# Patient Record
Sex: Male | Born: 1975 | Race: White | Hispanic: No | Marital: Single | Smoking: Current every day smoker
Health system: Southern US, Community
[De-identification: ages and names within clinical notes are randomized; demographics above are authoritative.]

## PROBLEM LIST (undated history)

## (undated) DIAGNOSIS — C801 Malignant (primary) neoplasm, unspecified: Secondary | ICD-10-CM

## (undated) DIAGNOSIS — F1910 Other psychoactive substance abuse, uncomplicated: Secondary | ICD-10-CM

## (undated) DIAGNOSIS — G4730 Sleep apnea, unspecified: Secondary | ICD-10-CM

## (undated) HISTORY — DX: Other psychoactive substance abuse, uncomplicated: F19.10

## (undated) HISTORY — PX: SKIN CANCER EXCISION: SHX779

## (undated) HISTORY — DX: Sleep apnea, unspecified: G47.30

## (undated) HISTORY — DX: Malignant (primary) neoplasm, unspecified: C80.1

---

## 2005-09-15 ENCOUNTER — Emergency Department: Payer: Self-pay | Admitting: Emergency Medicine

## 2006-08-18 ENCOUNTER — Emergency Department: Payer: Self-pay | Admitting: Emergency Medicine

## 2006-08-20 ENCOUNTER — Emergency Department: Payer: Self-pay | Admitting: Emergency Medicine

## 2007-10-29 IMAGING — CR LEFT MIDDLE FINGER 2+V
1 series · 3 of 3 positions shown · non-contrast
Comparison: none

REASON FOR EXAM: injury    ER WAIT
COMMENTS:

[Series 1: view not recorded · 0.17mm/px · 3 of 3 slices shown]
[im 1/3]
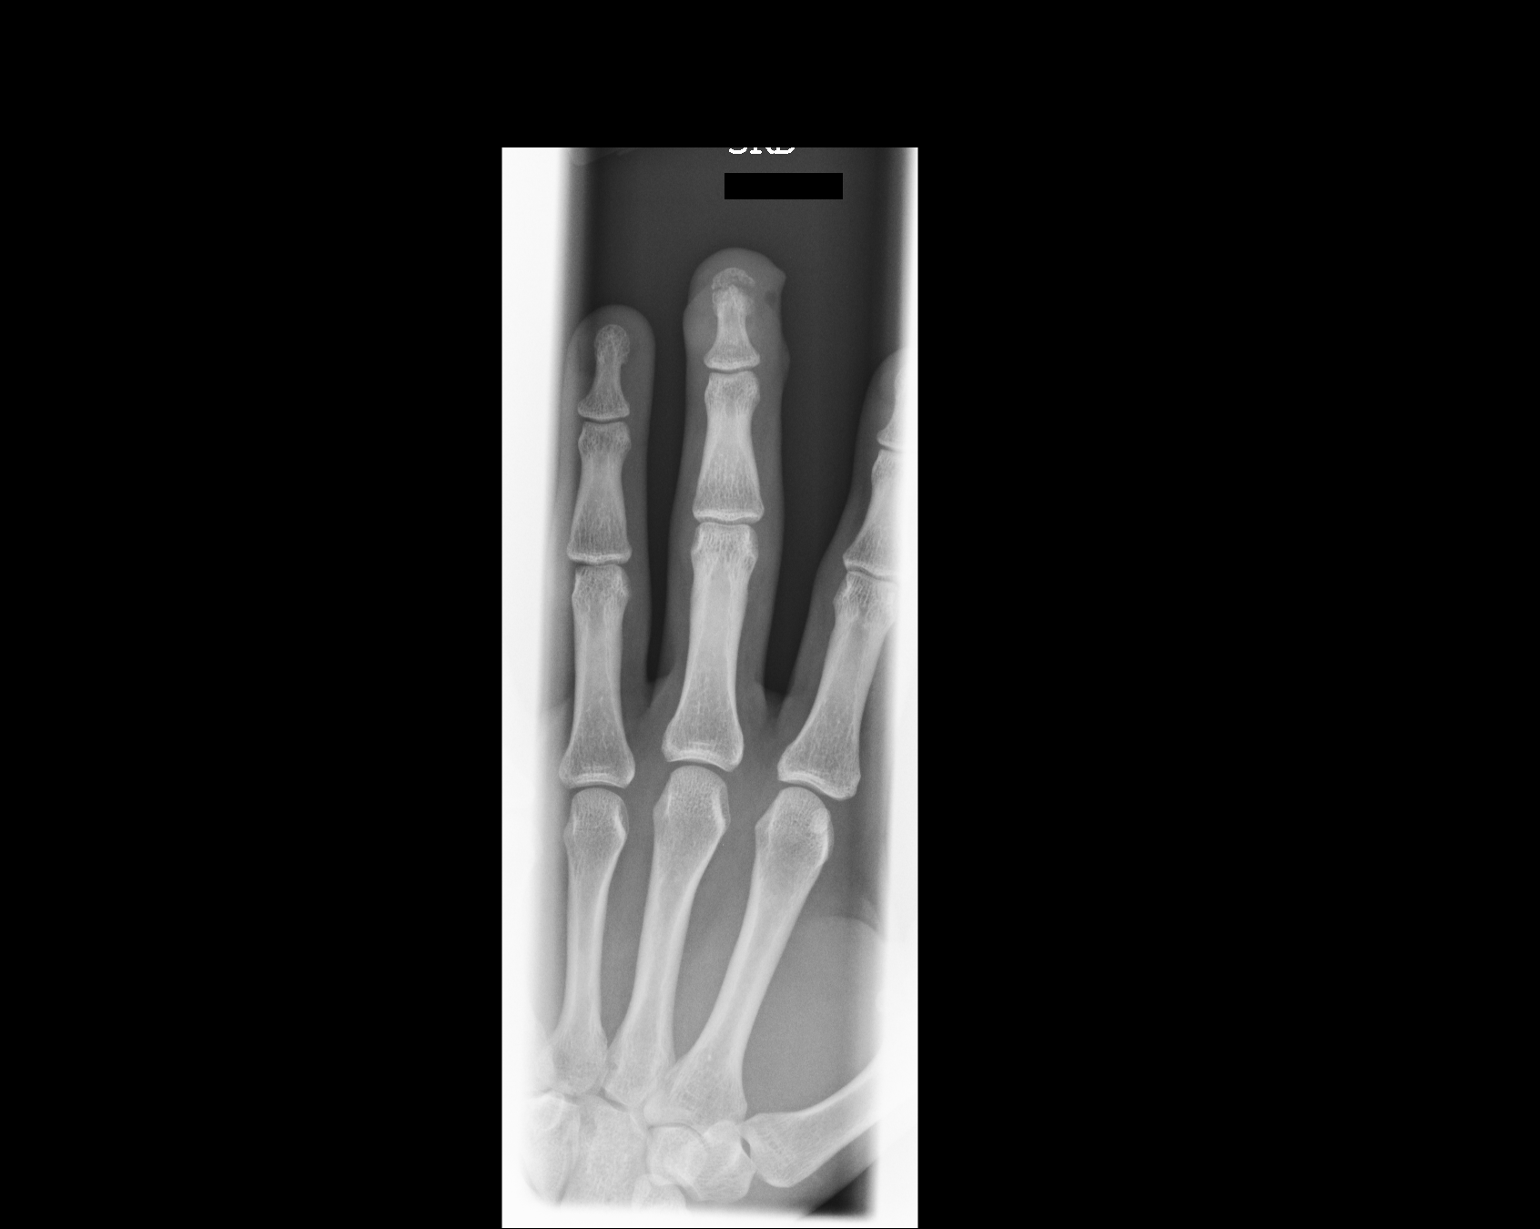
[im 2/3]
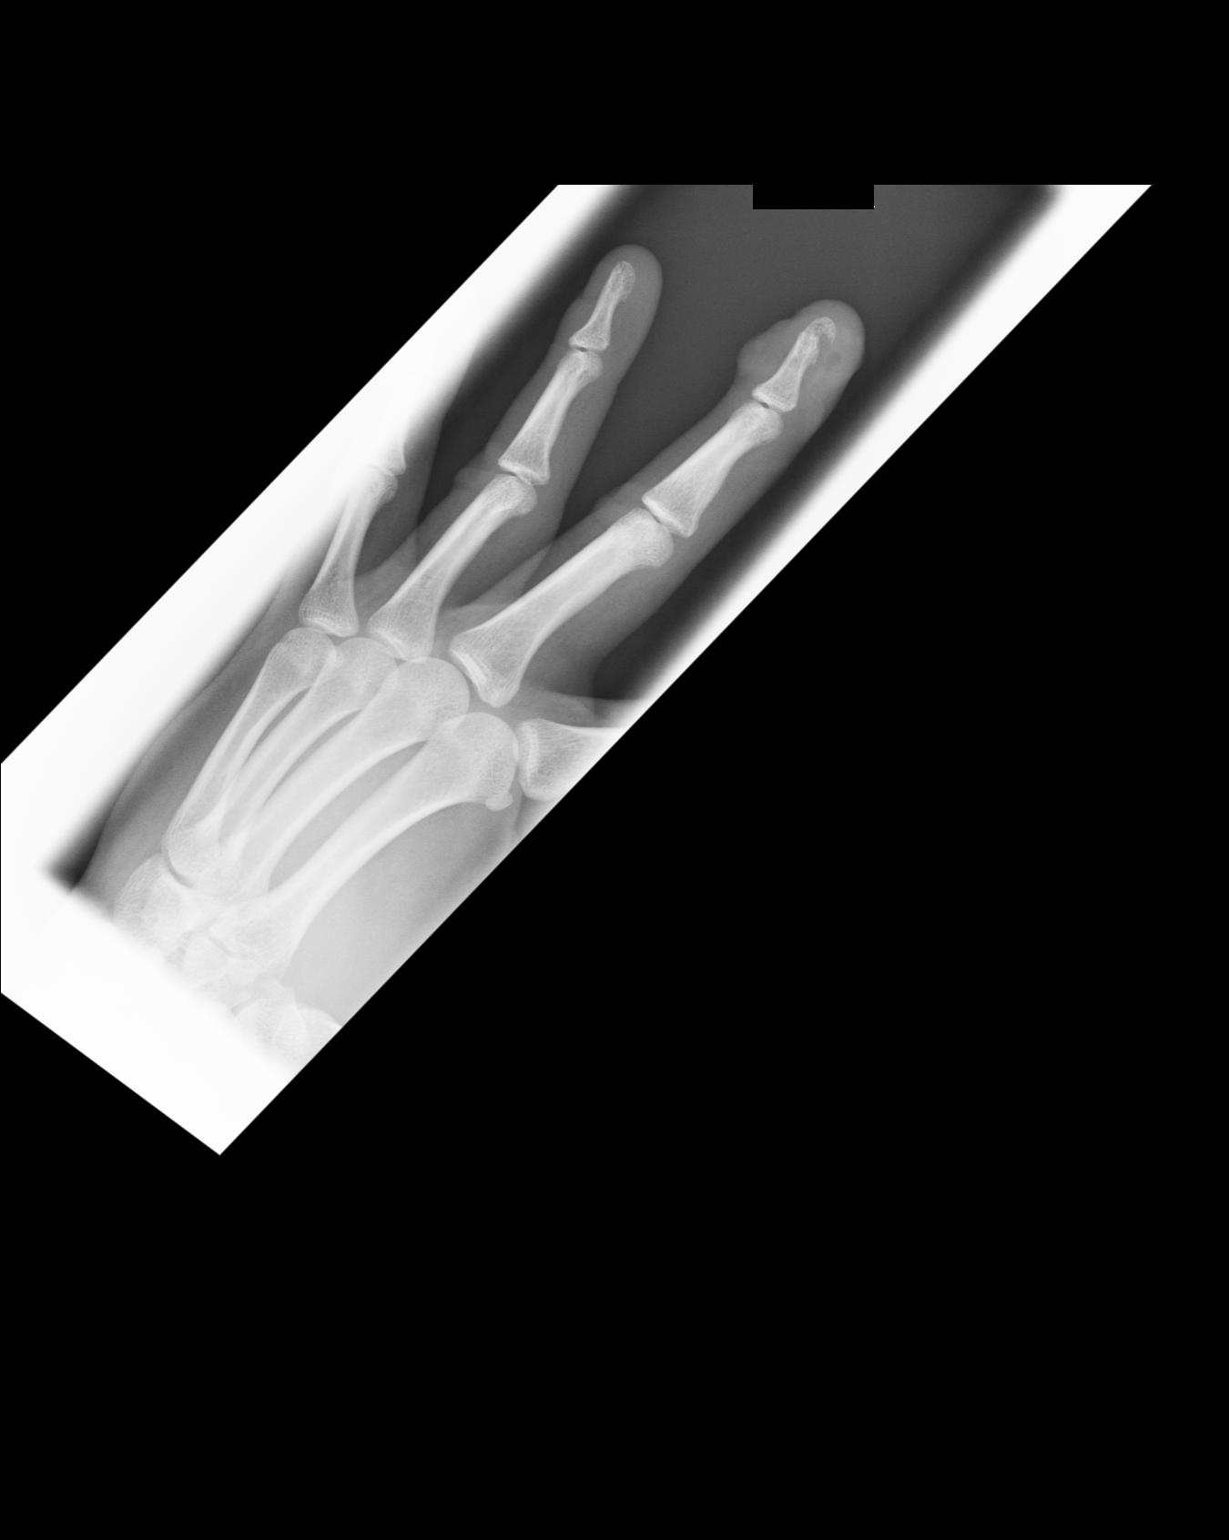
[im 3/3]
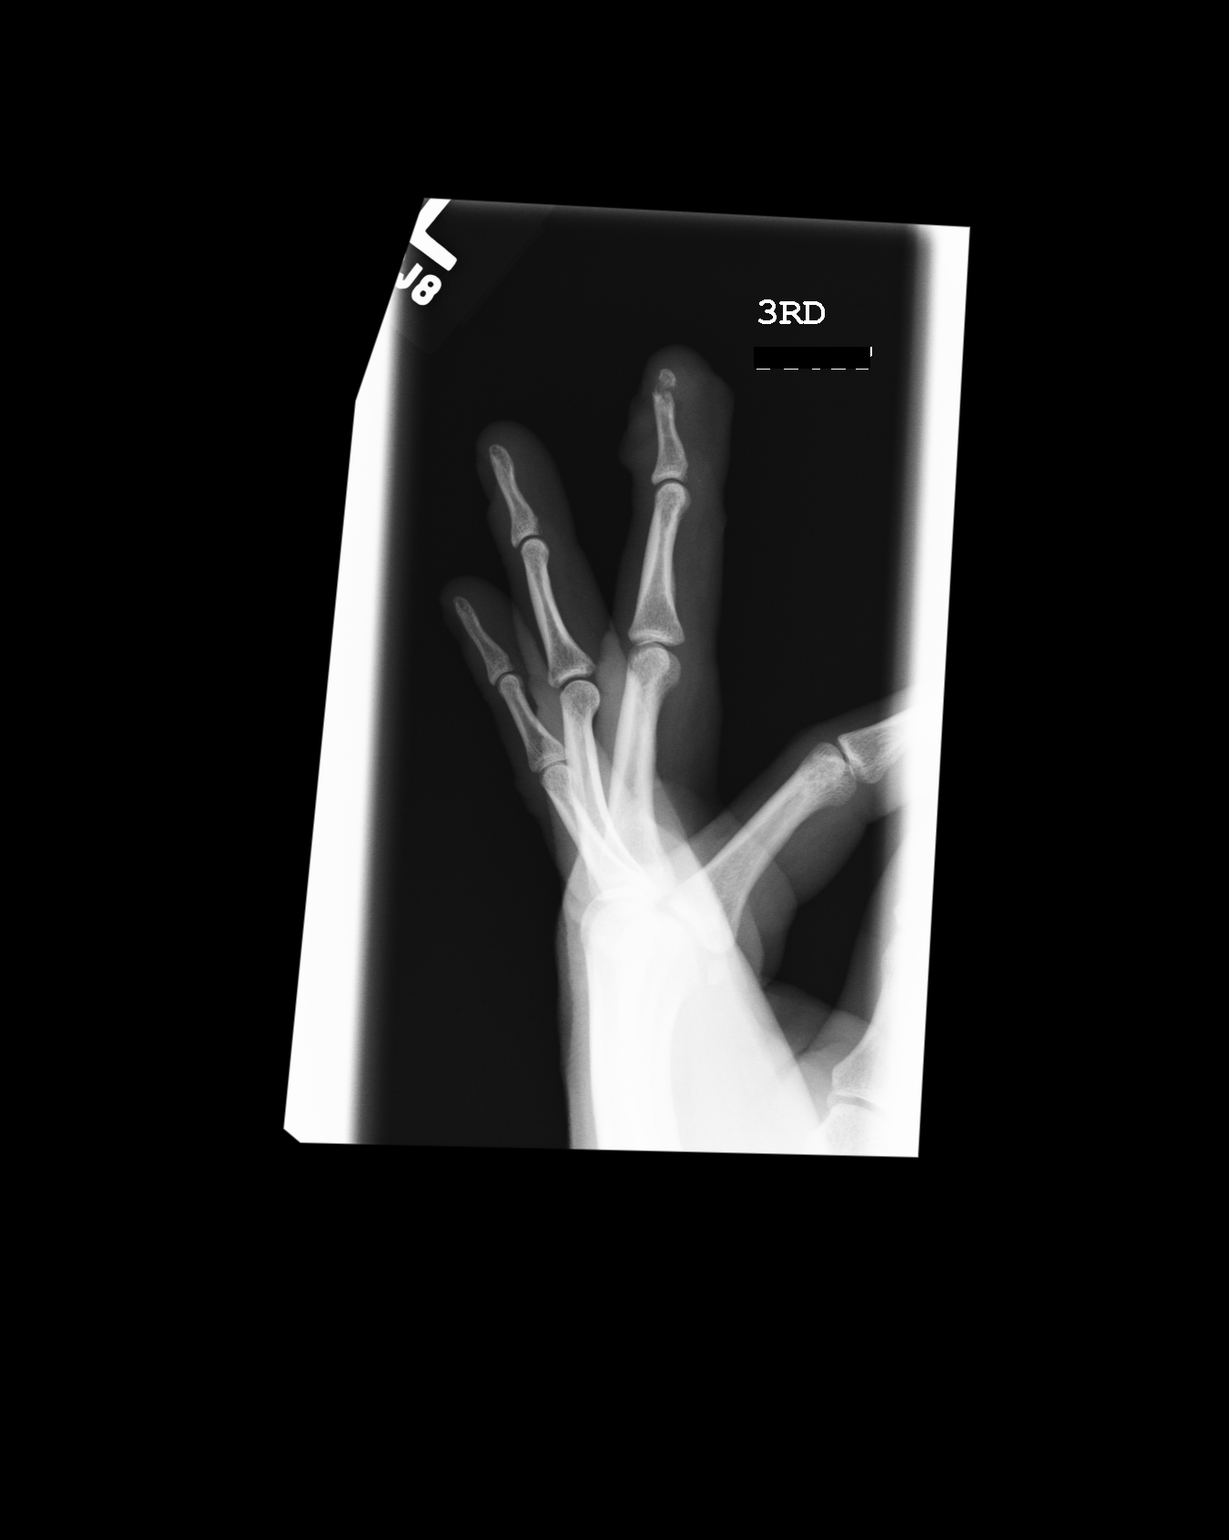

[3 of 3 positions shown; findings below may reference images not displayed]

PROCEDURE:     DXR - DXR FINGER MID 3RD DIGIT LT HAND  - August 18, 2006  [DATE]

RESULT:     Three views of the LEFT third finger show a fracture of the tuft
of the distal phalanx.  Bony fracture components are minimally displaced.
Associated soft tissue injury is observed.  No radiodense soft tissue
foreign body is seen.
IMPRESSION: 1)Fracture of the tuft of the distal phalanx of the LEFT middle finger.

2)No additional bony abnormalities are seen.

3)No radiopaque soft tissue foreign body is identified.

## 2008-08-12 ENCOUNTER — Emergency Department: Payer: Self-pay | Admitting: Emergency Medicine

## 2009-01-29 ENCOUNTER — Emergency Department: Payer: Self-pay | Admitting: Emergency Medicine

## 2016-09-24 DIAGNOSIS — C4491 Basal cell carcinoma of skin, unspecified: Secondary | ICD-10-CM

## 2016-09-24 HISTORY — DX: Basal cell carcinoma of skin, unspecified: C44.91

## 2017-04-09 ENCOUNTER — Ambulatory Visit: Payer: Self-pay | Admitting: Adult Health Nurse Practitioner

## 2017-04-09 DIAGNOSIS — R030 Elevated blood-pressure reading, without diagnosis of hypertension: Secondary | ICD-10-CM | POA: Insufficient documentation

## 2017-04-09 DIAGNOSIS — L819 Disorder of pigmentation, unspecified: Secondary | ICD-10-CM | POA: Insufficient documentation

## 2017-04-09 DIAGNOSIS — Z0000 Encounter for general adult medical examination without abnormal findings: Secondary | ICD-10-CM

## 2017-04-09 HISTORY — DX: Elevated blood-pressure reading, without diagnosis of hypertension: R03.0

## 2017-04-09 NOTE — Progress Notes (Signed)
  Patient: Trevor Estrada Male    DOB: 1976-03-26   41 y.o.   MRN: [id number] Visit Date: 04/09/2017  Today's Provider: Staci Acosta, NP   Chief Complaint  Patient presents with  . New Patient (Initial Visit)    Check for skin cancer, prior basal cell removed   Subjective:    HPI  Pt states that he thinks he has a spot of melanoma on the L testicle and basal cell on his R arm and chest.  States that the R arm and chest have been there since last summer and the melanoma x 3 years.  He thought the melanoma was a mole at first and now it looks like a little red bump.   Does not take any medication.      Denies family hx of DM, HTN, CA.   No Known Allergies Previous Medications   No medications on file    Review of Systems  All other systems reviewed and are negative.   Social History  Substance Use Topics  . Smoking status: Current Every Day Smoker    Packs/day: 0.75    Years: 30.00    Types: Cigarettes  . Smokeless tobacco: Not on file  . Alcohol use Yes     Comment: 2-3 12 packs a month   Objective:   BP (!) 143/96   Pulse 92   Temp 98.3 F (36.8 C)   Wt 192 lb 12.8 oz (87.5 kg)   Physical Exam  Constitutional: He is oriented to person, place, and time. He appears well-developed and well-nourished.  HENT:  Head: Normocephalic and atraumatic.  Neck: Normal range of motion. Neck supple.  Cardiovascular: Normal rate, regular rhythm, normal heart sounds and intact distal pulses.   Pulmonary/Chest: Effort normal and breath sounds normal.  Abdominal: Soft. Bowel sounds are normal.  Neurological: He is alert and oriented to person, place, and time.  Skin: Skin is warm and dry.     Vitals reviewed.       Assessment & Plan:      BP elevated today without dx of HTN.  Discussed low sodium diet and exercise.  Discussed smoking cessation.   FU in 4 weeks for BP repeat.      Due to hx of carcinoma removal will refer to Derm for skin surveillance.      Staci Acosta, NP   [facility]

## 2017-04-10 LAB — COMPREHENSIVE METABOLIC PANEL
ALK PHOS: 70 IU/L (ref 39–117)
ALT: 37 IU/L (ref 0–44)
AST: 35 IU/L (ref 0–40)
Albumin/Globulin Ratio: 2 (ref 1.2–2.2)
Albumin: 4.5 g/dL (ref 3.5–5.5)
BILIRUBIN TOTAL: 0.4 mg/dL (ref 0.0–1.2)
BUN/Creatinine Ratio: 6 — ABNORMAL LOW (ref 9–20)
BUN: 5 mg/dL — ABNORMAL LOW (ref 6–20)
CHLORIDE: 102 mmol/L (ref 96–106)
CO2: 21 mmol/L (ref 20–29)
Calcium: 9.1 mg/dL (ref 8.7–10.2)
Creatinine, Ser: 0.84 mg/dL (ref 0.76–1.27)
GFR calc Af Amer: 128 mL/min/{1.73_m2} (ref >59)
GFR calc non Af Amer: 110 mL/min/{1.73_m2} (ref >59)
GLOBULIN, TOTAL: 2.3 g/dL (ref 1.5–4.5)
Glucose: 88 mg/dL (ref 65–99)
Potassium: 3.7 mmol/L (ref 3.5–5.2)
SODIUM: 139 mmol/L (ref 134–144)
Total Protein: 6.8 g/dL (ref 6.0–8.5)

## 2017-04-10 LAB — LIPID PANEL
CHOLESTEROL TOTAL: 205 mg/dL — AB (ref 100–199)
Chol/HDL Ratio: 8.9 ratio — ABNORMAL HIGH (ref 0.0–5.0)
HDL: 23 mg/dL — ABNORMAL LOW (ref >39)
Triglycerides: 484 mg/dL — ABNORMAL HIGH (ref 0–149)

## 2017-04-10 LAB — CBC
HEMATOCRIT: 43.1 % (ref 37.5–51.0)
HEMOGLOBIN: 15 g/dL (ref 13.0–17.7)
MCH: 31.6 pg (ref 26.6–33.0)
MCHC: 34.8 g/dL (ref 31.5–35.7)
MCV: 91 fL (ref 79–97)
Platelets: 305 10*3/uL (ref 150–379)
RBC: 4.74 x10E6/uL (ref 4.14–5.80)
RDW: 14 % (ref 12.3–15.4)
WBC: 9.5 10*3/uL (ref 3.4–10.8)

## 2017-04-10 LAB — HEMOGLOBIN A1C
ESTIMATED AVERAGE GLUCOSE: 100 mg/dL
HEMOGLOBIN A1C: 5.1 % (ref 4.8–5.6)

## 2017-04-10 LAB — TSH: TSH: 1.52 u[IU]/mL (ref 0.450–4.500)

## 2017-04-16 ENCOUNTER — Telehealth: Payer: Self-pay

## 2017-04-16 NOTE — Telephone Encounter (Signed)
Called pt to go over lab results. Not available.

## 2017-04-16 NOTE — Telephone Encounter (Signed)
Called patient about referral to [place] appointment is July 12, at 2:30 pm and to let patient know he has been approved for [facility] through [facility].

## 2017-04-23 DIAGNOSIS — L570 Actinic keratosis: Secondary | ICD-10-CM

## 2017-04-23 HISTORY — DX: Actinic keratosis: L57.0

## 2017-06-03 ENCOUNTER — Ambulatory Visit: Payer: Self-pay | Admitting: Internal Medicine

## 2017-06-03 VITALS — BP 132/92 | HR 89 | Ht 69.0 in | Wt 190.0 lb

## 2017-06-03 DIAGNOSIS — I10 Essential (primary) hypertension: Secondary | ICD-10-CM

## 2017-06-03 MED ORDER — LISINOPRIL 10 MG PO TABS
5.0000 mg | ORAL_TABLET | Freq: Every day | ORAL | 1 refills | Status: DC
Start: 2017-06-03 — End: 2017-07-08

## 2017-06-03 NOTE — Progress Notes (Signed)
   Subjective:    Patient ID: Trevor Estrada, male    DOB: [birth year], 41 y.o.   MRN: [id number]  HPI   Pt is not currently taking medication for high BP. Pt does not recall history of high BP in family.    Patient Active Problem List   Diagnosis Date Noted  . Elevated BP without diagnosis of hypertension 04/09/2017  . Healthcare maintenance 04/09/2017  . Change in multiple pigmented skin lesions 04/09/2017   Allergies as of 06/03/2017   No Known Allergies     Medication List    as of 06/03/2017  9:47 AM   You have not been prescribed any medications.      Review of Systems     Objective:   Physical Exam  BP (!) 132/92   Pulse 89   Ht 5\' 9"  (1.753 m)   Wt 190 lb (86.2 kg)   BMI 28.06 kg/m   Manual BP recorded during encounter; 138/100    Assessment & Plan:   Advise patient on weight loss to decrease triglyceride levels.  Start taking lisinopril 5mg  once daily for the next 4 weeks.   F/U in 4 weeks w/ labs; Metb + lipid panel

## 2017-06-25 ENCOUNTER — Telehealth: Payer: Self-pay

## 2017-06-25 NOTE — Telephone Encounter (Signed)
Called to reschedule appt on 07/01/2017. Vmail not available.

## 2017-06-29 ENCOUNTER — Telehealth: Payer: Self-pay | Admitting: Pharmacy Technician

## 2017-06-29 NOTE — Telephone Encounter (Signed)
Patient eligible to receive medication assistance at [facility] through 2018, as long as eligibility requirements continue to be met.  Betty J. Kluttz Care Manager [facility] 

## 2017-07-01 ENCOUNTER — Other Ambulatory Visit: Payer: Self-pay

## 2017-07-08 ENCOUNTER — Ambulatory Visit: Payer: Self-pay | Admitting: Internal Medicine

## 2017-07-08 ENCOUNTER — Encounter: Payer: Self-pay | Admitting: Internal Medicine

## 2017-07-08 DIAGNOSIS — I10 Essential (primary) hypertension: Secondary | ICD-10-CM

## 2017-07-08 MED ORDER — LISINOPRIL 10 MG PO TABS: 5.0000 mg | ORAL_TABLET | Freq: Every day | ORAL | 3 refills | Status: DC
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
## 2017-07-09 LAB — LIPID PANEL
CHOL/HDL RATIO: 6.9 ratio — AB (ref 0.0–5.0)
CHOLESTEROL TOTAL: 187 mg/dL (ref 100–199)
HDL: 27 mg/dL — ABNORMAL LOW (ref >39)
LDL Calculated: 127 mg/dL — ABNORMAL HIGH (ref 0–99)
TRIGLYCERIDES: 167 mg/dL — AB (ref 0–149)
VLDL Cholesterol Cal: 33 mg/dL (ref 5–40)

## 2017-07-09 LAB — COMPREHENSIVE METABOLIC PANEL
A/G RATIO: 1.9 (ref 1.2–2.2)
ALT: 25 IU/L (ref 0–44)
AST: 24 IU/L (ref 0–40)
Albumin: 4.3 g/dL (ref 3.5–5.5)
Alkaline Phosphatase: 74 IU/L (ref 39–117)
BUN/Creatinine Ratio: 7 — ABNORMAL LOW (ref 9–20)
BUN: 6 mg/dL (ref 6–24)
Bilirubin Total: 0.4 mg/dL (ref 0.0–1.2)
CALCIUM: 9.5 mg/dL (ref 8.7–10.2)
CO2: 24 mmol/L (ref 20–29)
Chloride: 102 mmol/L (ref 96–106)
Creatinine, Ser: 0.84 mg/dL (ref 0.76–1.27)
GFR, EST AFRICAN AMERICAN: 127 mL/min/{1.73_m2} (ref >59)
GFR, EST NON AFRICAN AMERICAN: 110 mL/min/{1.73_m2} (ref >59)
Globulin, Total: 2.3 g/dL (ref 1.5–4.5)
Glucose: 92 mg/dL (ref 65–99)
POTASSIUM: 4.5 mmol/L (ref 3.5–5.2)
Sodium: 142 mmol/L (ref 134–144)
TOTAL PROTEIN: 6.6 g/dL (ref 6.0–8.5)

## 2017-07-09 LAB — CBC
HEMATOCRIT: 45.7 % (ref 37.5–51.0)
HEMOGLOBIN: 15.8 g/dL (ref 13.0–17.7)
MCH: 33.3 pg — AB (ref 26.6–33.0)
MCHC: 34.6 g/dL (ref 31.5–35.7)
MCV: 96 fL (ref 79–97)
Platelets: 301 10*3/uL (ref 150–379)
RBC: 4.75 x10E6/uL (ref 4.14–5.80)
RDW: 14.6 % (ref 12.3–15.4)
WBC: 6.4 10*3/uL (ref 3.4–10.8)

## 2017-12-30 ENCOUNTER — Other Ambulatory Visit: Payer: Self-pay

## 2018-01-06 ENCOUNTER — Ambulatory Visit: Payer: Self-pay | Admitting: Internal Medicine

## 2018-08-09 ENCOUNTER — Telehealth: Payer: Self-pay | Admitting: Pharmacy Technician

## 2018-08-09 NOTE — Telephone Encounter (Signed)
Patient failed to provide 2019 poi.  No additional medication assistance will be provided by [facility] without the required proof of income documentation.  Patient notified by letter.  Ridgely Anastacio J. Afton Mikelson Care Manager [facility] 

## 2020-07-11 ENCOUNTER — Ambulatory Visit: Payer: Self-pay | Admitting: Dermatology

## 2020-08-15 ENCOUNTER — Ambulatory Visit (INDEPENDENT_AMBULATORY_CARE_PROVIDER_SITE_OTHER): Payer: Self-pay | Admitting: Dermatology

## 2020-08-15 ENCOUNTER — Other Ambulatory Visit: Payer: Self-pay

## 2020-08-15 ENCOUNTER — Encounter: Payer: Self-pay | Admitting: Dermatology

## 2020-08-15 DIAGNOSIS — Z85828 Personal history of other malignant neoplasm of skin: Secondary | ICD-10-CM

## 2020-08-15 DIAGNOSIS — L821 Other seborrheic keratosis: Secondary | ICD-10-CM

## 2020-08-15 DIAGNOSIS — D485 Neoplasm of uncertain behavior of skin: Secondary | ICD-10-CM

## 2020-08-15 DIAGNOSIS — L700 Acne vulgaris: Secondary | ICD-10-CM

## 2020-08-15 DIAGNOSIS — L578 Other skin changes due to chronic exposure to nonionizing radiation: Secondary | ICD-10-CM

## 2020-08-15 NOTE — Progress Notes (Deleted)
   Follow-Up Visit   Subjective  Trevor Estrada is a 44 y.o. male who presents for the following: Lesions (on the R jaw, L ear, and R chest - patient has a history of basal cell carcinomas and is concerned they may be cancerous).  The following portions of the chart were reviewed this encounter and updated as appropriate:     Review of Systems:  No other skin or systemic complaints except as noted in HPI or Assessment and Plan.  Objective  Well appearing patient in no apparent distress; mood and affect are within normal limits.  A focused examination was performed including the face, ears, trunk, and extremities. Relevant physical exam findings are noted in the Assessment and Plan.   Assessment & Plan    Actinic Damage - chronic, secondary to cumulative UV radiation exposure/sun exposure over time - diffuse scaly erythematous macules with underlying dyspigmentation - Recommend daily broad spectrum sunscreen SPF 30+ to sun-exposed areas, reapply every 2 hours as needed.  - Call for new or changing lesions.  No follow-ups on file.  Luther Redo, CMA, am acting as scribe for Sarina Ser, MD .

## 2020-08-15 NOTE — Progress Notes (Addendum)
New Patient Visit  Subjective  Trevor Estrada is a 44 y.o. male who presents for the following: Lesions (on the R jaw, L ear, and R chest - patient has a history of basal cell carcinomas and is concerned they may be cancerous). The patient presents for Upper Body Skin Exam (UBSE) for skin cancer screening and mole check.  The following portions of the chart were reviewed this encounter and updated as appropriate:  Tobacco  Allergies  Meds  Problems  Med Hx  Surg Hx  Fam Hx     Review of Systems:  No other skin or systemic complaints except as noted in HPI or Assessment and Plan.  Objective  Well appearing patient in no apparent distress; mood and affect are within normal limits.  A focused examination was performed including the face, trunk, extremities. Relevant physical exam findings are noted in the Assessment and Plan.  Objective  R sup chest: 1.5 cm crusted pink patch   Objective  R cheek: 0.6 cm pink papule  Objective  L ear concha: 0.4 cm flesh colored papule   Objective  Inside the right upper eyelid: Firm nodule.   Assessment & Plan  Neoplasm of uncertain behavior of skin (3) R sup chest  Epidermal / dermal shaving  Lesion diameter (cm):  1.5 Informed consent: discussed and consent obtained   Timeout: patient name, date of birth, surgical site, and procedure verified   Procedure prep:  Patient was prepped and draped in usual sterile fashion Prep type:  Isopropyl alcohol Anesthesia: the lesion was anesthetized in a standard fashion   Anesthetic:  1% lidocaine w/ epinephrine 1-100,000 buffered w/ 8.4% NaHCO3 Instrument used: flexible razor blade   Hemostasis achieved with: pressure, aluminum chloride and electrodesiccation   Outcome: patient tolerated procedure well   Post-procedure details: sterile dressing applied and wound care instructions given   Dressing type: bandage and petrolatum    Destruction of lesion Complexity: extensive     Destruction method: electrodesiccation and curettage   Informed consent: discussed and consent obtained   Timeout:  patient name, date of birth, surgical site, and procedure verified Procedure prep:  Patient was prepped and draped in usual sterile fashion Prep type:  Isopropyl alcohol Anesthesia: the lesion was anesthetized in a standard fashion   Anesthetic:  1% lidocaine w/ epinephrine 1-100,000 buffered w/ 8.4% NaHCO3 Curettage performed in three different directions: Yes   Electrodesiccation performed over the curetted area: Yes   Lesion length (cm):  1.5 Lesion width (cm):  1.5 Margin per side (cm):  0.2 Final wound size (cm):  1.9 Hemostasis achieved with:  pressure, aluminum chloride and electrodesiccation Outcome: patient tolerated procedure well with no complications   Post-procedure details: sterile dressing applied and wound care instructions given   Dressing type: bandage and petrolatum    R cheek  Epidermal / dermal shaving  Lesion diameter (cm):  0.6 Informed consent: discussed and consent obtained   Timeout: patient name, date of birth, surgical site, and procedure verified   Procedure prep:  Patient was prepped and draped in usual sterile fashion Prep type:  Isopropyl alcohol Anesthesia: the lesion was anesthetized in a standard fashion   Anesthetic:  1% lidocaine w/ epinephrine 1-100,000 buffered w/ 8.4% NaHCO3 Instrument used: flexible razor blade   Hemostasis achieved with: pressure, aluminum chloride and electrodesiccation   Outcome: patient tolerated procedure well   Post-procedure details: sterile dressing applied and wound care instructions given   Dressing type: bandage and petrolatum  Destruction of lesion Complexity: extensive   Destruction method: electrodesiccation and curettage   Informed consent: discussed and consent obtained   Timeout:  patient name, date of birth, surgical site, and procedure verified Procedure prep:  Patient was prepped  and draped in usual sterile fashion Prep type:  Isopropyl alcohol Anesthesia: the lesion was anesthetized in a standard fashion   Anesthetic:  1% lidocaine w/ epinephrine 1-100,000 buffered w/ 8.4% NaHCO3 Curettage performed in three different directions: Yes   Electrodesiccation performed over the curetted area: Yes   Lesion length (cm):  0.6 Lesion width (cm):  0.6 Margin per side (cm):  0.2 Final wound size (cm):  1 Hemostasis achieved with:  pressure, aluminum chloride and electrodesiccation Outcome: patient tolerated procedure well with no complications   Post-procedure details: sterile dressing applied and wound care instructions given   Dressing type: bandage and petrolatum    L ear concha  Skin / nail biopsy Type of biopsy: tangential   Informed consent: discussed and consent obtained   Timeout: patient name, date of birth, surgical site, and procedure verified   Procedure prep:  Patient was prepped and draped in usual sterile fashion Prep type:  Isopropyl alcohol Anesthesia: the lesion was anesthetized in a standard fashion   Anesthetic:  1% lidocaine w/ epinephrine 1-100,000 buffered w/ 8.4% NaHCO3 Instrument used: flexible razor blade   Hemostasis achieved with: pressure, aluminum chloride and electrodesiccation   Outcome: patient tolerated procedure well   Post-procedure details: sterile dressing applied and wound care instructions given   Dressing type: bandage and petrolatum    Destruction of lesion Complexity: extensive   Destruction method: electrodesiccation and curettage   Informed consent: discussed and consent obtained   Timeout:  patient name, date of birth, surgical site, and procedure verified Procedure prep:  Patient was prepped and draped in usual sterile fashion Prep type:  Isopropyl alcohol Anesthesia: the lesion was anesthetized in a standard fashion   Anesthetic:  1% lidocaine w/ epinephrine 1-100,000 buffered w/ 8.4% NaHCO3 Curettage performed in  three different directions: Yes   Electrodesiccation performed over the curetted area: Yes   Lesion length (cm):  0.4 Lesion width (cm):  0.4 Margin per side (cm):  0 Final wound size (cm):  0.4 Hemostasis achieved with:  pressure, aluminum chloride and electrodesiccation Outcome: patient tolerated procedure well with no complications   Post-procedure details: sterile dressing applied and wound care instructions given   Dressing type: bandage and petrolatum    Acne vulgaris - acne papule vs cyst of eyelid.   Inside the right upper eyelid May resolve by its self.  If worsens or not resolving, see Ophthalmologist. Benign appearing, observe for changes.   Seborrheic Keratoses - Stuck-on, waxy, tan-brown papules and plaques  - Discussed benign etiology and prognosis. - Observe - Call for any changes  Actinic Damage - chronic, secondary to cumulative UV radiation exposure/sun exposure over time - diffuse scaly erythematous macules with underlying dyspigmentation - Recommend daily broad spectrum sunscreen SPF 30+ to sun-exposed areas, reapply every 2 hours as needed.  - Call for new or changing lesions.   Return in about 4 months (around 12/13/2020) for follow up in 4-6 months.  Luther Redo, CMA, am acting as scribe for Sarina Ser, MD .  Documentation: I have reviewed the above documentation for accuracy and completeness, and I agree with the above.  Sarina Ser, MD

## 2020-08-15 NOTE — Patient Instructions (Signed)

## 2020-08-20 ENCOUNTER — Telehealth: Payer: Self-pay

## 2020-08-20 NOTE — Telephone Encounter (Signed)
-----   Message from Ralene Bathe, MD sent at 08/16/2020  5:44 PM EDT ----- Diagnosis 1. Skin , right sup chest BASAL CELL CARCINOMA, SUPERFICIAL AND NODULAR PATTERNS 2. Skin , right cheek BASAL CELL CARCINOMA, SUPERFICIAL AND NODULAR PATTERNS 3. Skin , left ear concha SEBORRHEIC KERATOSIS, IRRITATED  1&2- both Cancer - BCC Both already treated Recheck next visit 3- benign irritated keratosis No further treatment needed.

## 2020-08-20 NOTE — Telephone Encounter (Signed)
Advised patient of results/hd  

## 2021-03-04 ENCOUNTER — Emergency Department: Payer: Self-pay

## 2021-03-04 ENCOUNTER — Inpatient Hospital Stay
Admission: EM | Admit: 2021-03-04 | Discharge: 2021-03-05 | DRG: 159 | Discharge status: Against Medical Advice | Payer: Self-pay | Attending: Internal Medicine | Admitting: Internal Medicine

## 2021-03-04 ENCOUNTER — Other Ambulatory Visit: Payer: Self-pay

## 2021-03-04 ENCOUNTER — Encounter: Payer: Self-pay | Admitting: Emergency Medicine

## 2021-03-04 DIAGNOSIS — L570 Actinic keratosis: Secondary | ICD-10-CM | POA: Diagnosis present

## 2021-03-04 DIAGNOSIS — R739 Hyperglycemia, unspecified: Secondary | ICD-10-CM | POA: Diagnosis present

## 2021-03-04 DIAGNOSIS — D72829 Elevated white blood cell count, unspecified: Secondary | ICD-10-CM | POA: Diagnosis present

## 2021-03-04 DIAGNOSIS — K122 Cellulitis and abscess of mouth: Principal | ICD-10-CM | POA: Diagnosis present

## 2021-03-04 DIAGNOSIS — Z8249 Family history of ischemic heart disease and other diseases of the circulatory system: Secondary | ICD-10-CM

## 2021-03-04 DIAGNOSIS — F17210 Nicotine dependence, cigarettes, uncomplicated: Secondary | ICD-10-CM | POA: Diagnosis present

## 2021-03-04 DIAGNOSIS — Y929 Unspecified place or not applicable: Secondary | ICD-10-CM

## 2021-03-04 DIAGNOSIS — I10 Essential (primary) hypertension: Secondary | ICD-10-CM

## 2021-03-04 DIAGNOSIS — Z85828 Personal history of other malignant neoplasm of skin: Secondary | ICD-10-CM

## 2021-03-04 DIAGNOSIS — Z20822 Contact with and (suspected) exposure to covid-19: Secondary | ICD-10-CM | POA: Diagnosis present

## 2021-03-04 DIAGNOSIS — T380X5A Adverse effect of glucocorticoids and synthetic analogues, initial encounter: Secondary | ICD-10-CM | POA: Diagnosis present

## 2021-03-04 DIAGNOSIS — E876 Hypokalemia: Secondary | ICD-10-CM

## 2021-03-04 DIAGNOSIS — G4730 Sleep apnea, unspecified: Secondary | ICD-10-CM | POA: Diagnosis present

## 2021-03-04 DIAGNOSIS — Z79899 Other long term (current) drug therapy: Secondary | ICD-10-CM

## 2021-03-04 HISTORY — DX: Essential (primary) hypertension: I10

## 2021-03-04 LAB — CBC WITH DIFFERENTIAL/PLATELET
Abs Immature Granulocytes: 0.09 10*3/uL — ABNORMAL HIGH (ref 0.00–0.07)
Basophils Absolute: 0.1 10*3/uL (ref 0.0–0.1)
Basophils Relative: 0 %
Eosinophils Absolute: 0.2 10*3/uL (ref 0.0–0.5)
Eosinophils Relative: 1 %
HCT: 40.3 % (ref 39.0–52.0)
Hemoglobin: 14.4 g/dL (ref 13.0–17.0)
Immature Granulocytes: 1 %
Lymphocytes Relative: 12 %
Lymphs Abs: 2.1 10*3/uL (ref 0.7–4.0)
MCH: 32.8 pg (ref 26.0–34.0)
MCHC: 35.7 g/dL (ref 30.0–36.0)
MCV: 91.8 fL (ref 80.0–100.0)
Monocytes Absolute: 1.3 10*3/uL — ABNORMAL HIGH (ref 0.1–1.0)
Monocytes Relative: 8 %
Neutro Abs: 13.5 10*3/uL — ABNORMAL HIGH (ref 1.7–7.7)
Neutrophils Relative %: 78 %
Platelets: 279 10*3/uL (ref 150–400)
RBC: 4.39 MIL/uL (ref 4.22–5.81)
RDW: 12.5 % (ref 11.5–15.5)
WBC: 17.2 10*3/uL — ABNORMAL HIGH (ref 4.0–10.5)
nRBC: 0 % (ref 0.0–0.2)

## 2021-03-04 LAB — COMPREHENSIVE METABOLIC PANEL
ALT: 31 U/L (ref 0–44)
AST: 21 U/L (ref 15–41)
Albumin: 3.7 g/dL (ref 3.5–5.0)
Alkaline Phosphatase: 64 U/L (ref 38–126)
Anion gap: 12 (ref 5–15)
BUN: 11 mg/dL (ref 6–20)
CO2: 24 mmol/L (ref 22–32)
Calcium: 8.7 mg/dL — ABNORMAL LOW (ref 8.9–10.3)
Chloride: 101 mmol/L (ref 98–111)
Creatinine, Ser: 0.91 mg/dL (ref 0.61–1.24)
GFR, Estimated: >60 mL/min (ref >60)
Glucose, Bld: 97 mg/dL (ref 70–99)
Potassium: 3.3 mmol/L — ABNORMAL LOW (ref 3.5–5.1)
Sodium: 137 mmol/L (ref 135–145)
Total Bilirubin: 1.2 mg/dL (ref 0.3–1.2)
Total Protein: 7 g/dL (ref 6.5–8.1)

## 2021-03-04 LAB — LACTIC ACID, PLASMA
Lactic Acid, Venous: 1 mmol/L (ref 0.5–1.9)
Lactic Acid, Venous: 1.2 mmol/L (ref 0.5–1.9)

## 2021-03-04 LAB — MAGNESIUM: Magnesium: 1.6 mg/dL — ABNORMAL LOW (ref 1.7–2.4)

## 2021-03-04 MED ORDER — ACETAMINOPHEN 325 MG PO TABS: 650.0000 mg | ORAL_TABLET | Freq: Four times a day (QID) | ORAL | Status: DC | PRN

## 2021-03-04 MED ORDER — DEXAMETHASONE SODIUM PHOSPHATE 10 MG/ML IJ SOLN
10.0000 mg | Freq: Once | INTRAMUSCULAR | Status: AC
  Administered 2021-03-04: 10 mg via INTRAVENOUS
  Filled 2021-03-04: qty 1

## 2021-03-04 MED ORDER — ACETAMINOPHEN 650 MG RE SUPP: 650.0000 mg | Freq: Four times a day (QID) | RECTAL | Status: DC | PRN

## 2021-03-04 MED ORDER — ENOXAPARIN SODIUM 60 MG/0.6ML IJ SOSY
0.5000 mg/kg | PREFILLED_SYRINGE | INTRAMUSCULAR | Status: DC
  Administered 2021-03-04: 47.5 mg via SUBCUTANEOUS
  Filled 2021-03-04: qty 0.6

## 2021-03-04 MED ORDER — POTASSIUM CHLORIDE 10 MEQ/100ML IV SOLN
10.0000 meq | INTRAVENOUS | Status: AC
  Administered 2021-03-04 (×3): 10 meq via INTRAVENOUS
  Filled 2021-03-04 (×3): qty 100

## 2021-03-04 MED ORDER — LIDOCAINE VISCOUS HCL 2 % MT SOLN
15.0000 mL | Freq: Three times a day (TID) | OROMUCOSAL | Status: DC | PRN
  Filled 2021-03-04: qty 15

## 2021-03-04 MED ORDER — SODIUM CHLORIDE 0.9 % IV SOLN
3.0000 g | Freq: Four times a day (QID) | INTRAVENOUS | Status: DC
  Administered 2021-03-04 – 2021-03-05 (×4): 3 g via INTRAVENOUS
  Filled 2021-03-04 (×2): qty 3
  Filled 2021-03-04: qty 8
  Filled 2021-03-04: qty 3
  Filled 2021-03-04: qty 8
  Filled 2021-03-04: qty 3
  Filled 2021-03-04: qty 8

## 2021-03-04 MED ORDER — MORPHINE SULFATE (PF) 4 MG/ML IV SOLN
4.0000 mg | Freq: Once | INTRAVENOUS | Status: AC
  Administered 2021-03-04: 4 mg via INTRAVENOUS
  Filled 2021-03-04: qty 1

## 2021-03-04 MED ORDER — ONDANSETRON HCL 4 MG/2ML IJ SOLN
4.0000 mg | Freq: Once | INTRAMUSCULAR | Status: AC
  Administered 2021-03-04: 4 mg via INTRAVENOUS
  Filled 2021-03-04: qty 2

## 2021-03-04 MED ORDER — SODIUM CHLORIDE 0.9% FLUSH
3.0000 mL | Freq: Two times a day (BID) | INTRAVENOUS | Status: DC
  Administered 2021-03-04 – 2021-03-05 (×2): 3 mL via INTRAVENOUS

## 2021-03-04 MED ORDER — IOHEXOL 300 MG/ML  SOLN
75.0000 mL | Freq: Once | INTRAMUSCULAR | Status: AC | PRN
  Administered 2021-03-04: 75 mL via INTRAVENOUS
  Filled 2021-03-04: qty 75

## 2021-03-04 MED ORDER — SODIUM CHLORIDE 0.9 % IV BOLUS
1000.0000 mL | Freq: Once | INTRAVENOUS | Status: AC
  Administered 2021-03-04: 1000 mL via INTRAVENOUS

## 2021-03-04 MED ORDER — LIDOCAINE VISCOUS HCL 2 % MT SOLN
15.0000 mL | Freq: Once | OROMUCOSAL | Status: AC
  Administered 2021-03-04: 15 mL via OROMUCOSAL
  Filled 2021-03-04: qty 15

## 2021-03-04 MED ORDER — LISINOPRIL 10 MG PO TABS
5.0000 mg | ORAL_TABLET | Freq: Every day | ORAL | Status: DC
  Filled 2021-03-04: qty 1

## 2021-03-04 MED ORDER — SODIUM CHLORIDE 0.9 % IV SOLN
INTRAVENOUS | Status: DC | PRN
  Administered 2021-03-04: 1000 mL via INTRAVENOUS

## 2021-03-04 MED ORDER — SODIUM CHLORIDE 0.9 % IV SOLN
3.0000 g | Freq: Once | INTRAVENOUS | Status: AC
  Administered 2021-03-04: 3 g via INTRAVENOUS
  Filled 2021-03-04: qty 8

## 2021-03-04 NOTE — Progress Notes (Signed)
PHARMACIST - PHYSICIAN COMMUNICATION  CONCERNING:  Enoxaparin (Lovenox) for DVT Prophylaxis    RECOMMENDATION: Patient was prescribed enoxaparin 40mg  q24 hours for VTE prophylaxis.   Filed Weights   03/04/21 1410  Weight: 97.1 kg (214 lb)    Body mass index is 31.6 kg/m.  Estimated Creatinine Clearance: 120.4 mL/min (by C-G formula based on SCr of 0.91 mg/dL).   Based on [place] patient is candidate for enoxaparin 0.5mg /kg TBW SQ every 24 hours based on BMI being >30.  DESCRIPTION: Pharmacy has adjusted enoxaparin dose per [facility] policy.  Patient is now receiving enoxaparin 47.5 mg every 24 hours    Benita Gutter 03/04/2021 7:21 PM

## 2021-03-04 NOTE — ED Triage Notes (Addendum)
Pt via POV from home. Pt was sent over from [facility]. Pt c/o throat swelling and facial swelling since Thursday. Denies SOB. Pt states it is painful to swallow. [place] did a strep test and it was negative. Denies pain, pt states throat is sore. Pt is A&Ox4 and NAD.

## 2021-03-04 NOTE — ED Notes (Signed)
See triage note  Presents with facial swelling and sore throat  States he felt like he had a sinus infection    Then he developed throat swelling   Low grade temp  Increased pain with swallowing

## 2021-03-04 NOTE — H&P (Signed)
History and Physical   Trevor Estrada ASN:[id number] DOB: 23-Aug-1976 DOA: 03/04/2021  PCP: [facility]  Patient coming from: Primary care office  Chief Complaint: Throat and facial swelling with difficulty swallowing  HPI: Trevor Estrada is a 45 y.o. male with medical history significant of hypertension, actinic keratosis, basal cell carcinoma who presents with throat and facial swelling.  Patient reports 4 days of throat and facial swelling that has been worsening.  States it has become painful/difficult to swallow.  Denies any shortness of breath.  Denies any pain when not attempting to swallow.  Was seen at outpatient clinic today and had a negative strep test there.  They sent him to the ED given his difficulty swallowing.  He states he has had some difficulty swallowing even liquids now.  He states the lidocaine he received in the ED helped with the pain but otherwise his pain is not very severe.  Denies fevers, chills, chest pain, shortness of breath, abdominal pain, constipation, diarrhea, nausea, vomiting.  ED Course: Vital signs in the ED are stable initially was tachycardic to the 110s but this is improved to around 100.  Satting well on room air.  Lab work-up showed CMP with potassium 3.3 and calcium 8.7.  CBC with leukocytosis to 17.2.  Lactic acid normal with repeat pending.  Blood cultures pending.  Respiratory panel for flu and COVID pending.  CT neck showed enlarged tonsils, attenuation at the tongue base with extension posteriorly.  Epiglottic fold edema.  Changes consistent with infection with forming abscess although malignancy is also on the differential.  Patient was discussed with ENT who states based on patient's exam and rapid onset of symptoms infection is most likely.  Recommend admission with IV antibiotics and monitor for improvement.  If patient improves within 48 hours can discharge with outpatient follow-up with ENT otherwise will need to be  reconsulted.  Patient received dose of morphine, Unasyn, Decadron, Zofran, lidocaine and a liter of fluids in the ED.  Review of Systems: As per HPI otherwise all other systems reviewed and are negative.   Past Medical History:  Diagnosis Date  . Actinic keratosis 04/23/2017   left temple at hairline   . Basal cell carcinoma 04/23/2017   right mid dorsum lateral forearm   . Basal cell carcinoma 04/23/2017   right superior chest   . Basal cell carcinoma 09/24/2016   right temple  . Basal cell carcinoma 08/15/2020   right sup chest, right cheek  . Cancer (HCC)    Basal cell  . Elevated BP without diagnosis of hypertension 04/09/2017  . HTN (hypertension) 03/04/2021  . Sleep apnea    patient suspected  . Substance abuse ([place])    Alcohol    Past Surgical History:  Procedure Laterality Date  . SKIN CANCER EXCISION  December    Social History  reports that he has been smoking cigarettes. He has a 22.50 pack-year smoking history. He has never used smokeless tobacco. He reports current alcohol use. He reports that he does not use drugs.  No Known Allergies  Family History  Problem Relation Age of Onset  . Cancer Maternal Aunt   . Cancer Paternal Uncle   . Heart disease Maternal Grandfather   Reviewed on admission  Prior to Admission medications   Medication Sig Start Date End Date Taking? Authorizing Provider  lisinopril (PRINIVIL,ZESTRIL) 10 MG tablet Take 0.5 tablets (5 mg total) by mouth daily. 07/08/17   Tawni Millers, MD  Physical Exam: Vitals:   03/04/21 1410 03/04/21 1743  BP: (!) 138/91 137/88  Pulse: (!) 119 100  Resp: 20 18  Temp: 98.9 F (37.2 C)   TempSrc: Oral   SpO2: 94% 95%  Weight: 97.1 kg   Height: 5\' 9"  (1.753 m)    Physical Exam Constitutional:      General: He is not in acute distress.    Appearance: Normal appearance.  HENT:     Head: Normocephalic and atraumatic.     Mouth/Throat:     Mouth: Mucous membranes are moist.     Pharynx:  Oropharynx is clear.     Comments: Mild tongue lifting and swelling in right oral cavity noted. Eyes:     Extraocular Movements: Extraocular movements intact.     Pupils: Pupils are equal, round, and reactive to light.  Neck:     Comments: Edema noted of submandibular space and superior anterior neck Cardiovascular:     Rate and Rhythm: Normal rate and regular rhythm.     Pulses: Normal pulses.     Heart sounds: Normal heart sounds.  Pulmonary:     Effort: Pulmonary effort is normal. No respiratory distress.     Breath sounds: Normal breath sounds. No stridor.  Abdominal:     General: Bowel sounds are normal. There is no distension.     Palpations: Abdomen is soft.     Tenderness: There is no abdominal tenderness.  Musculoskeletal:        General: No swelling or deformity.  Skin:    General: Skin is warm and dry.  Neurological:     General: No focal deficit present.     Mental Status: Mental status is at baseline.    Labs on Admission: I have personally reviewed following labs and imaging studies  CBC: Recent Labs  Lab 03/04/21 1653  WBC 17.2*  NEUTROABS 13.5*  HGB 14.4  HCT 40.3  MCV 91.8  PLT 409    Basic Metabolic Panel: Recent Labs  Lab 03/04/21 1652 03/04/21 1653  NA  --  137  K  --  3.3*  CL  --  101  CO2  --  24  GLUCOSE  --  97  BUN  --  11  CREATININE  --  0.91  CALCIUM  --  8.7*  MG 1.6*  --     GFR: Estimated Creatinine Clearance: 120.4 mL/min (by C-G formula based on SCr of 0.91 mg/dL).  Liver Function Tests: Recent Labs  Lab 03/04/21 1653  AST 21  ALT 31  ALKPHOS 64  BILITOT 1.2  PROT 7.0  ALBUMIN 3.7    Urine analysis: No results found for: COLORURINE, APPEARANCEUR, LABSPEC, PHURINE, GLUCOSEU, HGBUR, BILIRUBINUR, KETONESUR, PROTEINUR, UROBILINOGEN, NITRITE, LEUKOCYTESUR  Radiological Exams on Admission: CT Soft Tissue Neck W Contrast  Result Date: 03/04/2021 CLINICAL DATA:  Throat swelling and facial swelling EXAM: CT NECK  WITH CONTRAST TECHNIQUE: Multidetector CT imaging of the neck was performed using the standard protocol following the bolus administration of intravenous contrast. CONTRAST:  44mL OMNIPAQUE IOHEXOL 300 MG/ML  SOLN COMPARISON:  None. FINDINGS: Pharynx and larynx: Enlargement of the palatine tonsils. There is a 4 mm area of low attenuation on the right (series 2, image 35). There is also prominence of the lingual tonsils. Anteriorly, within the tongue base and floor of mouth, there is a 1.5 x 1.3 x 1.1 cm area of low attenuation (series 2, image 63) that probably communicates with a small area more superiorly on  image 59. Mild thickening of the epiglottis. Mild thickening of the aryepiglottic folds with effacement of piriform sinuses. Larynx is unremarkable. Salivary glands: Parotid and submandibular glands are unremarkable. Thyroid: Normal. Lymph nodes: Top normal and mildly enlarged cervical nodes bilaterally. For example, right level 2 node measuring 2.2 cm (series 2, image 45); right level 2 node measuring 1.7 cm on image 56; 1.4 cm left level 2 node on image 53. Vascular: Major neck vessels are patent. Limited intracranial: No abnormal enhancement. Visualized orbits: Unremarkable. Mastoids and visualized paranasal sinuses: Mastoid air cells are clear. Paranasal sinus mucosal thickening. Skeleton: Minor degenerative changes are present primarily at C5-C6. Upper chest: Included lung apices are clear. Other: None. IMPRESSION: Enlargement of palatine and lingual tonsils. Areas of low attenuation extending anteriorly into the tongue base and floor of mouth and within the right palatine tonsil. There is epiglottic and aryepiglottic fold edema. Given provided history, this could reflect an infectious process with abscess formation. However, appearance is also suspicious for malignancy with necrosis, noting smoking history. Bilateral cervical adenopathy, which could be reactive or metastatic. Electronically Signed   By:  Macy Mis M.D.   On: 03/04/2021 18:15   EKG: Not performed in the ED.  Assessment/Plan Principal Problem:   Ludwig's angina Active Problems:   Hypokalemia  Ludwick's angina > Patient presenting 4 days of throat and facial swelling now with difficulty swallowing.  Denies shortness of breath. > Submandibular space infection shown on CT neck.  Imaging differential did include malignancy however this is less likely given the acute course and exam. > ENT consulted in the ED and recommended admission with IV antibiotics.  If patient improves in 48 hours can DC with outpatient ENT follow-up.  Recommend reconsult for inpatient ENT consult if patient persist after 48 hours. > Received Unasyn, Decadron in the ED. - Monitor on telemetry overnight - Continue with Unasyn 3 g every 6 hours - Trend fever curve and white count - Full liquid diet  Hypokalemia > Potassium noted to be 3.3 in the ED. - Given difficulty swallowing we will replete with 30 mEq IV potassium - Check magnesium - Trend renal function and electrolytes  DVT prophylaxis: Lovenox  Code Status:   Full  Family Communication:  Patient states that his family knows he is staying and did not need further update tonight. Disposition Plan:   Patient is from:  Home  Anticipated DC to:  Home  Anticipated DC date:  1 to 3 days  Anticipated DC barriers: None  Consults called:  ENT consulted by EDP.  Recommends reconsult if patient not improving in 48 hours.   Admission status:  Observation, telemetry   Severity of Illness: The appropriate patient status for this patient is OBSERVATION. Observation status is judged to be reasonable and necessary in order to provide the required intensity of service to ensure the patient's safety. The patient's presenting symptoms, physical exam findings, and initial radiographic and laboratory data in the context of their medical condition is felt to place them at decreased risk for further clinical  deterioration. Furthermore, it is anticipated that the patient will be medically stable for discharge from the hospital within 2 midnights of admission. The following factors support the patient status of observation.   " The patient's presenting symptoms include throat and facial swelling, difficulty swallowing. " The physical exam findings include neck and submandibular swelling, raised tongue with mild edema noted in the oral cavity. " The initial radiographic and laboratory data are CT soft tissue  neck with enlarged tonsils, attenuation of the tongue base extending posteriorly, epiglottic fold edema consistent with infection with forming abscess with malignancy also on the differential.  Potassium 3.3, leukocytosis to 17.2.   Marcelyn Bruins MD [facility]  How to contact the [facility] Attending or Consulting provider [place] or covering provider during after hours [place], for this patient?   1. Check the care team in [facility] and look for a) attending/consulting [facility] provider listed and b) the [facility] team listed 2. Log into [email or web address] and use [place]'s universal password to access. If you do not have the password, please contact the hospital operator. 3. Locate the [facility] provider you are looking for under [facility] and page to a number that you can be directly reached. 4. If you still have difficulty reaching the provider, please page the [facility] (Director on Call) for the Hospitalists listed on amion for assistance.  03/04/2021, 7:44 PM

## 2021-03-04 NOTE — ED Provider Notes (Signed)
[facility] Emergency Department Provider Note  ____________________________________________  Time seen: Approximately 4:32 PM  I have reviewed the triage vital signs and the nursing notes.   HISTORY  Chief Complaint Oral Swelling    HPI Trevor Estrada is a 45 y.o. male who presents the emergency department complaining of sore throat, swelling under his chin/jaw.  Patient states that he has had a sore throat for almost a week.  It has been progressively worsening.  He has had voice changes, pain/difficulty due to pain with swallowing.  No difficulty breathing.  Patient denies any fevers, nasal congestion, cough.  Patient states that he has visible swelling under his jaw but has pain along the anterior neck as well.         Past Medical History:  Diagnosis Date  . Actinic keratosis 04/23/2017   left temple at hairline   . Basal cell carcinoma 04/23/2017   right mid dorsum lateral forearm   . Basal cell carcinoma 04/23/2017   right superior chest   . Basal cell carcinoma 09/24/2016   right temple  . Basal cell carcinoma 08/15/2020   right sup chest, right cheek  . Cancer (HCC)    Basal cell  . Sleep apnea    patient suspected  . Substance abuse ([place])    Alcohol    Patient Active Problem List   Diagnosis Date Noted  . Ludwig's angina 03/04/2021  . Elevated BP without diagnosis of hypertension 04/09/2017  . Healthcare maintenance 04/09/2017  . Change in multiple pigmented skin lesions 04/09/2017    Past Surgical History:  Procedure Laterality Date  . SKIN CANCER EXCISION  December    Prior to Admission medications   Medication Sig Start Date End Date Taking? Authorizing Provider  lisinopril (PRINIVIL,ZESTRIL) 10 MG tablet Take 0.5 tablets (5 mg total) by mouth daily. 07/08/17   Tawni Millers, MD    Allergies Patient has no known allergies.  Family History  Problem Relation Age of Onset  . Cancer Maternal Aunt   . Cancer  Paternal Uncle   . Heart disease Maternal Grandfather     Social History Social History   Tobacco Use  . Smoking status: Current Every Day Smoker    Packs/day: 0.75    Years: 30.00    Pack years: 22.50    Types: Cigarettes  . Smokeless tobacco: Never Used  Substance Use Topics  . Alcohol use: Yes    Comment: 2-3 12 packs a month  . Drug use: No     Review of Systems  Constitutional: No fever/chills Eyes: No visual changes. No discharge ENT: Sore throat with pain under the mandible Cardiovascular: no chest pain. Respiratory: no cough. No SOB. Gastrointestinal: No abdominal pain.  No nausea, no vomiting.  No diarrhea.  No constipation. Musculoskeletal: Negative for musculoskeletal pain. Skin: Negative for rash, abrasions, lacerations, ecchymosis. Neurological: Negative for headaches, focal weakness or numbness.  10 System ROS otherwise negative.  ____________________________________________   PHYSICAL EXAM:  VITAL SIGNS: ED Triage Vitals [03/04/21 1410]  Enc Vitals Group     BP (!) 138/91     Pulse Rate (!) 119     Resp 20     Temp 98.9 F (37.2 C)     Temp Source Oral     SpO2 94 %     Weight 214 lb (97.1 kg)     Height 5\' 9"  (1.753 m)     Head Circumference      Peak Flow  Pain Score 10     Pain Loc      Pain Edu?      Excl. in [place]?      Constitutional: Alert and oriented. Well appearing and in no acute distress. Eyes: Conjunctivae are normal. PERRL. EOMI. Head: Atraumatic. ENT:      Ears:       Nose: No congestion/rhinnorhea.      Mouth/Throat: Mucous membranes are moist.  Bilateral erythema and edema to the tonsils without significant difference in the hypertrophy between the tonsils.  No uvular deviation.  Patient's tongue is unable to fully extend, tender under the tongue to palpation with firmness.  Palpation in the submandibular area reveals significant tenderness as well as significant firmness from mandible to mandible and extending into  the anterior neck. Neck: No stridor.  Visualization of the anterior neck reveals edema with firmness extending from the submandibular area into the anterior neck.  No posterior tenderness.  No posterior findings. Hematological/Lymphatic/Immunilogical: Anterior cervical lymphadenopathy. Cardiovascular: Normal rate, regular rhythm. Normal S1 and S2.  Good peripheral circulation. Respiratory: Normal respiratory effort without tachypnea or retractions. Lungs CTAB. Good air entry to the bases with no decreased or absent breath sounds. Musculoskeletal: Full range of motion to all extremities. No gross deformities appreciated. Neurologic:  Normal speech and language. No gross focal neurologic deficits are appreciated.  Skin:  Skin is warm, dry and intact. No rash noted. Psychiatric: Mood and affect are normal. Speech and behavior are normal. Patient exhibits appropriate insight and judgement.   ____________________________________________   LABS (all labs ordered are listed, but only abnormal results are displayed)  Labs Reviewed  COMPREHENSIVE METABOLIC PANEL - Abnormal; Notable for the following components:      Result Value   Potassium 3.3 (*)    Calcium 8.7 (*)    All other components within normal limits  CBC WITH DIFFERENTIAL/PLATELET - Abnormal; Notable for the following components:   WBC 17.2 (*)    Neutro Abs 13.5 (*)    Monocytes Absolute 1.3 (*)    Abs Immature Granulocytes 0.09 (*)    All other components within normal limits  CULTURE, BLOOD (ROUTINE X 2)  CULTURE, BLOOD (ROUTINE X 2)  SARS CORONAVIRUS 2 (TAT 6-24 HRS)  LACTIC ACID, PLASMA  LACTIC ACID, PLASMA   ____________________________________________  EKG   ____________________________________________  RADIOLOGY I personally viewed and evaluated these images as part of my medical decision making, as well as reviewing the written report by the radiologist.  ED Provider Interpretation: Visualization of CT imaging  reveals edema, low-attenuation area in the floor the mouth at the base of the tongue.  Appears to be slightly worse on the right than left but does involve both sides.  There is epiglottic and aryepiglottic fold edema.  Based off clinical presentation with physical exam and labs, feel this reflects infectious process.  CT Soft Tissue Neck W Contrast  Result Date: 03/04/2021 CLINICAL DATA:  Throat swelling and facial swelling EXAM: CT NECK WITH CONTRAST TECHNIQUE: Multidetector CT imaging of the neck was performed using the standard protocol following the bolus administration of intravenous contrast. CONTRAST:  16mL OMNIPAQUE IOHEXOL 300 MG/ML  SOLN COMPARISON:  None. FINDINGS: Pharynx and larynx: Enlargement of the palatine tonsils. There is a 4 mm area of low attenuation on the right (series 2, image 35). There is also prominence of the lingual tonsils. Anteriorly, within the tongue base and floor of mouth, there is a 1.5 x 1.3 x 1.1 cm area of low attenuation (  series 2, image 63) that probably communicates with a small area more superiorly on image 59. Mild thickening of the epiglottis. Mild thickening of the aryepiglottic folds with effacement of piriform sinuses. Larynx is unremarkable. Salivary glands: Parotid and submandibular glands are unremarkable. Thyroid: Normal. Lymph nodes: Top normal and mildly enlarged cervical nodes bilaterally. For example, right level 2 node measuring 2.2 cm (series 2, image 45); right level 2 node measuring 1.7 cm on image 56; 1.4 cm left level 2 node on image 53. Vascular: Major neck vessels are patent. Limited intracranial: No abnormal enhancement. Visualized orbits: Unremarkable. Mastoids and visualized paranasal sinuses: Mastoid air cells are clear. Paranasal sinus mucosal thickening. Skeleton: Minor degenerative changes are present primarily at C5-C6. Upper chest: Included lung apices are clear. Other: None. IMPRESSION: Enlargement of palatine and lingual tonsils. Areas  of low attenuation extending anteriorly into the tongue base and floor of mouth and within the right palatine tonsil. There is epiglottic and aryepiglottic fold edema. Given provided history, this could reflect an infectious process with abscess formation. However, appearance is also suspicious for malignancy with necrosis, noting smoking history. Bilateral cervical adenopathy, which could be reactive or metastatic. Electronically Signed   By: Macy Mis M.D.   On: 03/04/2021 18:15    ____________________________________________    PROCEDURES  Procedure(s) performed:    Procedures    Medications  Ampicillin-Sulbactam (UNASYN) 3 g in sodium chloride 0.9 % 100 mL IVPB (has no administration in time range)  lisinopril (ZESTRIL) tablet 5 mg (has no administration in time range)  enoxaparin (LOVENOX) injection 40 mg (has no administration in time range)  sodium chloride flush (NS) 0.9 % injection 3 mL (has no administration in time range)  acetaminophen (TYLENOL) tablet 650 mg (has no administration in time range)    Or  acetaminophen (TYLENOL) suppository 650 mg (has no administration in time range)  sodium chloride 0.9 % bolus 1,000 mL (0 mLs Intravenous Stopped 03/04/21 1816)  ondansetron (ZOFRAN) injection 4 mg (4 mg Intravenous Given 03/04/21 1705)  morphine 4 MG/ML injection 4 mg (4 mg Intravenous Given 03/04/21 1705)  dexamethasone (DECADRON) injection 10 mg (10 mg Intravenous Given 03/04/21 1705)  Ampicillin-Sulbactam (UNASYN) 3 g in sodium chloride 0.9 % 100 mL IVPB (0 g Intravenous Stopped 03/04/21 1736)  iohexol (OMNIPAQUE) 300 MG/ML solution 75 mL (75 mLs Intravenous Contrast Given 03/04/21 1731)  lidocaine (XYLOCAINE) 2 % viscous mouth solution 15 mL (15 mLs Mouth/Throat Given 03/04/21 1801)     ____________________________________________   INITIAL IMPRESSION / ASSESSMENT AND PLAN / ED COURSE  Pertinent labs & imaging results that were available during my care of the  patient were reviewed by me and considered in my medical decision making (see chart for details).  Review of the [place] CSRS was performed in accordance of the [place] prior to dispensing any controlled drugs.           Patient's diagnosis is consistent with Ludwigs angina.  Patient presented to the emergency department complaining of sore throat, pain at the base of the tongue/anterior neck region with edema of the submandibular and anterior neck space.  Patient had been referred from urgent care after negative strep test.  Patient did have voice changes noticed on entering the room.  Physical exam revealed wheezing of the base of tongue, tenderness along the sublingual space and significant edema and tenderness in the submandibular space.  Given the voice changes, worsening sore throat, physical exam findings concern existed for Ludwick's angina, Lemierre's, peritonsillar abscess,  retropharyngeal abscess.  I discussed the patient with radiologist given the contrast shortage and it was determined that the patient would be an appropriate patient for CT contrast.  CT was performed and findings are consistent with infectious process at the base of the tongue with a area of attenuation that could be concerning for forming abscess.  Radiologist was concerned that this also could be a presentation for malignancy with necrosis.  I discussed the patient with on-call ENT, Dr. Kathyrn Sheriff.  Given the rapid onset as well as labs and physical exam findings it was felt that this was infectious.  Patient had already been started on Unasyn, Decadron while waiting for results.  At this time ENT recommends admission to the hospital service for IV antibiotics.  As long as patient is improving symptomatically he can have outpatient follow-up with ENT.  Any nonimprovement after 48 hours or worsening would necessitate ENT direct consult while in the hospital.  I will reach out to the hospitalist team at this time for admission.        ____________________________________________  FINAL CLINICAL IMPRESSION(S) / ED DIAGNOSES  Final diagnoses:  Ludwig's angina      NEW MEDICATIONS STARTED DURING THIS VISIT:  ED Discharge Orders    None          This chart was dictated using voice recognition software/Dragon. Despite best efforts to proofread, errors can occur which can change the meaning. Any change was purely unintentional.    Darletta Moll, PA-C 03/04/21 1919    Naaman Plummer, MD 03/05/21 [phone]

## 2021-03-05 DIAGNOSIS — D72828 Other elevated white blood cell count: Secondary | ICD-10-CM

## 2021-03-05 DIAGNOSIS — R739 Hyperglycemia, unspecified: Secondary | ICD-10-CM

## 2021-03-05 LAB — CBC
HCT: 38.1 % — ABNORMAL LOW (ref 39.0–52.0)
HCT: 38.1 % — ABNORMAL LOW (ref 39.0–52.0)
Hemoglobin: 13.1 g/dL (ref 13.0–17.0)
Hemoglobin: 13.3 g/dL (ref 13.0–17.0)
MCH: 32.5 pg (ref 26.0–34.0)
MCH: 32.8 pg (ref 26.0–34.0)
MCHC: 34.4 g/dL (ref 30.0–36.0)
MCHC: 34.9 g/dL (ref 30.0–36.0)
MCV: 94.5 fL (ref 80.0–100.0)
MCV: 94.1 fL (ref 80.0–100.0)
Platelets: 266 10*3/uL (ref 150–400)
Platelets: 321 10*3/uL (ref 150–400)
RBC: 4.03 MIL/uL — ABNORMAL LOW (ref 4.22–5.81)
RBC: 4.05 MIL/uL — ABNORMAL LOW (ref 4.22–5.81)
RDW: 12.3 % (ref 11.5–15.5)
RDW: 12.2 % (ref 11.5–15.5)
WBC: 18 10*3/uL — ABNORMAL HIGH (ref 4.0–10.5)
WBC: 25.8 10*3/uL — ABNORMAL HIGH (ref 4.0–10.5)
nRBC: 0 % (ref 0.0–0.2)
nRBC: 0 % (ref 0.0–0.2)

## 2021-03-05 LAB — HIV ANTIBODY (ROUTINE TESTING W REFLEX): HIV Screen 4th Generation wRfx: NONREACTIVE

## 2021-03-05 LAB — BASIC METABOLIC PANEL
Anion gap: 8 (ref 5–15)
BUN: 14 mg/dL (ref 6–20)
CO2: 27 mmol/L (ref 22–32)
Calcium: 8.6 mg/dL — ABNORMAL LOW (ref 8.9–10.3)
Chloride: 103 mmol/L (ref 98–111)
Creatinine, Ser: 0.82 mg/dL (ref 0.61–1.24)
GFR, Estimated: >60 mL/min (ref >60)
Glucose, Bld: 178 mg/dL — ABNORMAL HIGH (ref 70–99)
Potassium: 4.2 mmol/L (ref 3.5–5.1)
Sodium: 138 mmol/L (ref 135–145)

## 2021-03-05 LAB — SARS CORONAVIRUS 2 (TAT 6-24 HRS): SARS Coronavirus 2: NEGATIVE

## 2021-03-05 MED ORDER — OXYCODONE-ACETAMINOPHEN 5-325 MG PO TABS: 1.0000 | ORAL_TABLET | Freq: Four times a day (QID) | ORAL | 0 refills | Status: AC | PRN

## 2021-03-05 MED ORDER — NICOTINE 21 MG/24HR TD PT24
21.0000 mg | MEDICATED_PATCH | Freq: Every day | TRANSDERMAL | Status: DC
  Administered 2021-03-05: 21 mg via TRANSDERMAL
  Filled 2021-03-05: qty 1

## 2021-03-05 MED ORDER — NICOTINE 14 MG/24HR TD PT24: 14.0000 mg | MEDICATED_PATCH | Freq: Every day | TRANSDERMAL | Status: DC

## 2021-03-05 MED ORDER — AMOXICILLIN-POT CLAVULANATE 875-125 MG PO TABS: 1.0000 | ORAL_TABLET | Freq: Two times a day (BID) | ORAL | 0 refills | Status: AC

## 2021-03-05 NOTE — Plan of Care (Signed)
  Problem: Education: Goal: Knowledge of General Education information will improve Description: Including pain rating scale, medication(s)/side effects and non-pharmacologic comfort measures Outcome: Progressing   Problem: Clinical Measurements: Goal: Will remain free from infection Outcome: Not Progressing Goal: Respiratory complications will improve Outcome: Progressing   Problem: Nutrition: Goal: Adequate nutrition will be maintained Outcome: Progressing   Problem: Pain Managment: Goal: General experience of comfort will improve Outcome: Progressing

## 2021-03-05 NOTE — Progress Notes (Signed)
PROGRESS NOTE   HPI was taken from Dr. Trilby Drummer: Trevor Estrada is a 45 y.o. male with medical history significant of hypertension, actinic keratosis, basal cell carcinoma who presents with throat and facial swelling.             Patient reports 4 days of throat and facial swelling that has been worsening.  States it has become painful/difficult to swallow.  Denies any shortness of breath.  Denies any pain when not attempting to swallow.  Was seen at outpatient clinic today and had a negative strep test there.  They sent him to the ED given his difficulty swallowing.             He states he has had some difficulty swallowing even liquids now.  He states the lidocaine he received in the ED helped with the pain but otherwise his pain is not very severe.  Denies fevers, chills, chest pain, shortness of breath, abdominal pain, constipation, diarrhea, nausea, vomiting.  ED Course: Vital signs in the ED are stable initially was tachycardic to the 110s but this is improved to around 100.  Satting well on room air.  Lab work-up showed CMP with potassium 3.3 and calcium 8.7.  CBC with leukocytosis to 17.2.  Lactic acid normal with repeat pending.  Blood cultures pending.  Respiratory panel for flu and COVID pending.  CT neck showed enlarged tonsils, attenuation at the tongue base with extension posteriorly.  Epiglottic fold edema.  Changes consistent with infection with forming abscess although malignancy is also on the differential.  Patient was discussed with ENT who states based on patient's exam and rapid onset of symptoms infection is most likely.  Recommend admission with IV antibiotics and monitor for improvement.  If patient improves within 48 hours can discharge with outpatient follow-up with ENT otherwise will need to be reconsulted.  Patient received dose of morphine, Unasyn, Decadron, Zofran, lidocaine and a liter of fluids in the ED.   Trevor Estrada  JJH:[id number] DOB: 07-28-1976 DOA:  03/04/2021 PCP: Pcp, No    Assessment & Plan:   Principal Problem:   Ludwig's angina Active Problems:   Hypokalemia  Ludwig angina: w/ submadibular space infection as per CT neck. Continue on IV unasyn. S/p IV decadron x 1 in the ER. If pt improves in 48 hours can d/c w/ po abxs and f/u w/ ENT outpatient. If no improvement re-consult ENT inpatient. Pt able to swallow liquids today and will advance to soft diet   Hypokalemia: WNL today  Leukocytosis: secondary to above. Continue on IV abxs  Hyperglycemia: no hx of DM & possibly secondary to steroid use. Will check HbA1c    DVT prophylaxis: lovenox  Code Status: full  Family Communication:  Disposition Plan:  Likely d/c back home.   Level of care: Med-Surg   Status is: Observation  The patient remains OBS appropriate and will d/c before 2 midnights. Can d/c tomorrow w/ po abxs if pt continues to clinically improve  Dispo: The patient is from: Home              Anticipated d/c is to: Home              Patient currently is not medically stable to d/c.   Difficult to place patient: unclear    Consultants:      Procedures:    Antimicrobials: unasyn   Subjective: Pt c/o throat pain, but improved  Objective: Vitals:   03/04/21 1743 03/04/21 2029 03/04/21 2354  03/05/21 0547  BP: 137/88 120/86 120/82 (!) 119/92  Pulse: 100 96 88 90  Resp: 18 16 15 16   Temp:  99.4 F (37.4 C) 99 F (37.2 C) 97.8 F (36.6 C)  TempSrc:  Oral Oral Oral  SpO2: 95% 95% 94% 94%  Weight:      Height:        Intake/Output Summary (Last 24 hours) at 03/05/2021 0821 Last data filed at 03/05/2021 0636 Gross per 24 hour  Intake 1360.8 ml  Output --  Net 1360.8 ml   Filed Weights   03/04/21 1410  Weight: 97.1 kg    Examination:  General exam: Appears calm and comfortable  Respiratory system: Clear to auscultation. Respiratory effort normal. Cardiovascular system: S1 & S2 +. No  rubs, gallops or clicks.  Gastrointestinal  system: Abdomen is nondistended, soft and nontender. Normal bowel sounds heard. Central nervous system: Alert and oriented. Moves all extremities  Psychiatry: Judgement and insight appear normal. Flat mood and affect    Data Reviewed: I have personally reviewed following labs and imaging studies  CBC: Recent Labs  Lab 03/04/21 1653 03/05/21 0538  WBC 17.2* 18.0*  NEUTROABS 13.5*  --   HGB 14.4 13.1  HCT 40.3 38.1*  MCV 91.8 94.5  PLT 279 182   Basic Metabolic Panel: Recent Labs  Lab 03/04/21 1652 03/04/21 1653 03/05/21 0538  NA  --  137 138  K  --  3.3* 4.2  CL  --  101 103  CO2  --  24 27  GLUCOSE  --  97 178*  BUN  --  11 14  CREATININE  --  0.91 0.82  CALCIUM  --  8.7* 8.6*  MG 1.6*  --   --    GFR: Estimated Creatinine Clearance: 133.6 mL/min (by C-G formula based on SCr of 0.82 mg/dL). Liver Function Tests: Recent Labs  Lab 03/04/21 1653  AST 21  ALT 31  ALKPHOS 64  BILITOT 1.2  PROT 7.0  ALBUMIN 3.7   No results for input(s): LIPASE, AMYLASE in the last 168 hours. No results for input(s): AMMONIA in the last 168 hours. Coagulation Profile: No results for input(s): INR, PROTIME in the last 168 hours. Cardiac Enzymes: No results for input(s): CKTOTAL, CKMB, CKMBINDEX, TROPONINI in the last 168 hours. BNP (last 3 results) No results for input(s): PROBNP in the last 8760 hours. HbA1C: No results for input(s): HGBA1C in the last 72 hours. CBG: No results for input(s): GLUCAP in the last 168 hours. Lipid Profile: No results for input(s): CHOL, HDL, LDLCALC, TRIG, CHOLHDL, LDLDIRECT in the last 72 hours. Thyroid Function Tests: No results for input(s): TSH, T4TOTAL, FREET4, T3FREE, THYROIDAB in the last 72 hours. Anemia Panel: No results for input(s): VITAMINB12, FOLATE, FERRITIN, TIBC, IRON, RETICCTPCT in the last 72 hours. Sepsis Labs: Recent Labs  Lab 03/04/21 1652 03/04/21 2110  LATICACIDVEN 1.0 1.2    Recent Results (from the past 240  hour(s))  Culture, blood (routine x 2)     Status: None (Preliminary result)   Collection Time: 03/04/21  4:48 PM   Specimen: BLOOD  Result Value Ref Range Status   Specimen Description BLOOD BLOOD RIGHT ARM  Final   Special Requests   Final    BOTTLES DRAWN AEROBIC AND ANAEROBIC Blood Culture adequate volume   Culture   Final    NO GROWTH < 24 HOURS Performed at [facility], [address] [place],  [address]    Report Status PENDING  Incomplete  Culture, blood (routine x 2)     Status: None (Preliminary result)   Collection Time: 03/04/21  4:52 PM   Specimen: BLOOD  Result Value Ref Range Status   Specimen Description BLOOD RIGHT ANTECUBITAL  Final   Special Requests   Final    BOTTLES DRAWN AEROBIC AND ANAEROBIC Blood Culture results may not be optimal due to an excessive volume of blood received in culture bottles   Culture  Setup Time PENDING  Incomplete   Culture   Final    NO GROWTH < 24 HOURS Performed at [facility], [address] [place], [place] [address]    Report Status PENDING  Incomplete         Radiology Studies: CT Soft Tissue Neck W Contrast  Result Date: 03/04/2021 CLINICAL DATA:  Throat swelling and facial swelling EXAM: CT NECK WITH CONTRAST TECHNIQUE: Multidetector CT imaging of the neck was performed using the standard protocol following the bolus administration of intravenous contrast. CONTRAST:  10mL OMNIPAQUE IOHEXOL 300 MG/ML  SOLN COMPARISON:  None. FINDINGS: Pharynx and larynx: Enlargement of the palatine tonsils. There is a 4 mm area of low attenuation on the right (series 2, image 35). There is also prominence of the lingual tonsils. Anteriorly, within the tongue base and floor of mouth, there is a 1.5 x 1.3 x 1.1 cm area of low attenuation (series 2, image 63) that probably communicates with a small area more superiorly on image 59. Mild thickening of the epiglottis. Mild thickening of the aryepiglottic folds with  effacement of piriform sinuses. Larynx is unremarkable. Salivary glands: Parotid and submandibular glands are unremarkable. Thyroid: Normal. Lymph nodes: Top normal and mildly enlarged cervical nodes bilaterally. For example, right level 2 node measuring 2.2 cm (series 2, image 45); right level 2 node measuring 1.7 cm on image 56; 1.4 cm left level 2 node on image 53. Vascular: Major neck vessels are patent. Limited intracranial: No abnormal enhancement. Visualized orbits: Unremarkable. Mastoids and visualized paranasal sinuses: Mastoid air cells are clear. Paranasal sinus mucosal thickening. Skeleton: Minor degenerative changes are present primarily at C5-C6. Upper chest: Included lung apices are clear. Other: None. IMPRESSION: Enlargement of palatine and lingual tonsils. Areas of low attenuation extending anteriorly into the tongue base and floor of mouth and within the right palatine tonsil. There is epiglottic and aryepiglottic fold edema. Given provided history, this could reflect an infectious process with abscess formation. However, appearance is also suspicious for malignancy with necrosis, noting smoking history. Bilateral cervical adenopathy, which could be reactive or metastatic. Electronically Signed   By: Macy Mis M.D.   On: 03/04/2021 18:15        Scheduled Meds: . enoxaparin (LOVENOX) injection  0.5 mg/kg Subcutaneous Q24H  . lisinopril  5 mg Oral Daily  . sodium chloride flush  3 mL Intravenous Q12H   Continuous Infusions: . sodium chloride 10 mL/hr at 03/05/21 0636  . ampicillin-sulbactam (UNASYN) IV Stopped (03/05/21 3244)     LOS: 0 days    Time spent: 32 mins     Wyvonnia Dusky, MD [facility] Pager [phone]  If 7PM-7AM, please contact night-coverage 03/05/2021, 8:21 AM

## 2021-03-05 NOTE — Plan of Care (Signed)
Patient leaving AMA.   Problem: Education: Goal: Knowledge of General Education information will improve Description: Including pain rating scale, medication(s)/side effects and non-pharmacologic comfort measures 03/05/2021 1905 by Orvan Seen, RN Outcome: Not Met (add Reason) 03/05/2021 1558 by Orvan Seen, RN Outcome: Progressing   Problem: Clinical Measurements: Goal: Will remain free from infection 03/05/2021 1905 by Orvan Seen, RN Outcome: Not Met (add Reason) 03/05/2021 1558 by Orvan Seen, RN Outcome: Progressing Goal: Respiratory complications will improve 03/05/2021 1905 by Orvan Seen, RN Outcome: Not Met (add Reason) 03/05/2021 1558 by Orvan Seen, RN Outcome: Progressing   Problem: Nutrition: Goal: Adequate nutrition will be maintained 03/05/2021 1905 by Orvan Seen, RN Outcome: Not Met (add Reason) 03/05/2021 1558 by Orvan Seen, RN Outcome: Progressing   Problem: Pain Managment: Goal: General experience of comfort will improve 03/05/2021 1905 by Orvan Seen, RN Outcome: Not Met (add Reason) 03/05/2021 1558 by Orvan Seen, RN Outcome: Progressing

## 2021-03-05 NOTE — Progress Notes (Addendum)
Patient ambulated out of unit AMA (AMA form completed and in chart).  MD and RN advised patient all day about recommending to stay to complete course of therapy and treatment.  PIV x 1 removed, no bleeding, intact.  Patient verbalized understanding signs and symptoms of infection.  Pain medication and antibiotic medication prescribed by MD and sent directly to patient's preferred pharmacy.

## 2021-03-05 NOTE — Plan of Care (Signed)
  Problem: Education: Goal: Knowledge of General Education information will improve Description: Including pain rating scale, medication(s)/side effects and non-pharmacologic comfort measures Outcome: Progressing   Problem: Clinical Measurements: Goal: Will remain free from infection Outcome: Progressing Goal: Respiratory complications will improve Outcome: Progressing   Problem: Nutrition: Goal: Adequate nutrition will be maintained Outcome: Progressing   Problem: Pain Managment: Goal: General experience of comfort will improve Outcome: Progressing

## 2021-03-08 ENCOUNTER — Emergency Department: Admission: EM | Admit: 2021-03-08 | Discharge: 2021-03-08 | Discharge status: Against Medical Advice | Payer: Self-pay

## 2021-03-08 NOTE — ED Notes (Signed)
Pt called in the [facility] with no response

## 2021-03-08 NOTE — ED Notes (Signed)
Pt called in the [facility] with no response 

## 2021-03-08 NOTE — ED Notes (Signed)
Pt called in the [facility] with no response at this time

## 2021-03-09 LAB — CULTURE, BLOOD (ROUTINE X 2)
Culture: NO GROWTH
Special Requests: ADEQUATE

## 2021-03-10 LAB — CULTURE, BLOOD (ROUTINE X 2): Culture: NO GROWTH

## 2021-03-13 NOTE — Discharge Summary (Addendum)
Physician Discharge Summary  Trevor Estrada WIO:[id number] DOB: 12-07-1975 DOA: 03/04/2021  PCP: Merryl Hacker, No  Admit date: 03/04/2021 Discharge date: 03/13/2021  Admitted From: home  Disposition:  Pt left AMA   Recommendations for Outpatient Follow-up:  1. Pt left AMA   Home Health: no  Equipment/Devices:  Discharge Condition: stable  CODE STATUS: full  Diet recommendation: regular  Brief/Interim Summary: HPI was taken from Dr. Trilby Drummer: Doreene Nest Whitfieldis a 45 y.o.malewith medical history significant ofhypertension, actinic keratosis, basal cell carcinoma who presents with throat and facial swelling. Patient reports 4 days of throat and facial swelling that has been worsening. States it has become painful/difficultto swallow. Denies any shortness of breath. Denies any pain when not attempting to swallow.Was seen at outpatient clinic today and had a negative strep test there. They sent him to the ED given his difficulty swallowing. He states he has had some difficulty swallowing even liquids now. He states the lidocaine he received in the ED helped with the pain but otherwise his pain is not very severe. Denies fevers, chills, chest pain, shortness of breath, abdominal pain, constipation, diarrhea, nausea, vomiting.  ED Course:Vital signs in the ED are stable initially was tachycardic to the 110s but this is improved to around 100. Satting well on room air. Lab work-up showed CMP with potassium 3.3 and calcium 8.7. CBC with leukocytosis to 17.2. Lactic acid normal with repeat pending. Blood cultures pending. Respiratory panel for flu and COVID pending. CT neck showed enlarged tonsils, attenuation at the tongue base with extension posteriorly. Epiglottic fold edema. Changes consistent with infection with forming abscess although malignancy is also on the differential. Patient was discussed with ENT who states based on patient's exam and rapid  onset of symptoms infection is most likely. Recommend admission with IV antibiotics and monitor for improvement. If patient improves within 48 hours can discharge with outpatient follow-up with ENT otherwise will need to be reconsulted. Patient received dose of morphine, Unasyn, Decadron, Zofran, lidocaine and a liter of fluids in the ED.  Pt decided to leave AMA. Long discussion w/ pt was had the morning the pt left AMA in regards to why the pt should stay 1 day more. Unfortunately pt decided to leave AMA. Po abxs and pain meds were still sent to the pt's pharmacy.    Discharge Diagnoses:  Principal Problem:   Ludwig's angina Active Problems:   Hypokalemia  Ludwig angina: w/ submadibular space infection as per CT neck. Continue on IV unasyn. S/p IV decadron x 1 in the ER. If pt improves in 48 hours can d/c w/ po abxs and f/u w/ ENT outpatient. If no improvement re-consult ENT inpatient. Pt able to swallow liquids today and will advance to soft diet   Hypokalemia: WNL today  Leukocytosis: secondary to above. Continue on IV abxs  Hyperglycemia: no hx of DM & possibly secondary to steroid use. Will check HbA1c   Discharge Instructions   Allergies as of 03/05/2021   No Known Allergies     Medication List    ASK your doctor about these medications   amoxicillin-clavulanate 875-125 MG tablet Commonly known as: Augmentin Take 1 tablet by mouth 2 (two) times daily for 7 days. Ask about: Should I take this medication?   lisinopril 10 MG tablet Commonly known as: ZESTRIL Take 0.5 tablets (5 mg total) by mouth daily.   oxyCODONE-acetaminophen 5-325 MG tablet Commonly known as: Percocet Take 1 tablet by mouth every 6 (six) hours as needed for up to  3 days for moderate pain or severe pain. Ask about: Should I take this medication?       No Known Allergies  Consultations:    Procedures/Studies: CT Soft Tissue Neck W Contrast  Result Date: 03/04/2021 CLINICAL DATA:   Throat swelling and facial swelling EXAM: CT NECK WITH CONTRAST TECHNIQUE: Multidetector CT imaging of the neck was performed using the standard protocol following the bolus administration of intravenous contrast. CONTRAST:  79mL OMNIPAQUE IOHEXOL 300 MG/ML  SOLN COMPARISON:  None. FINDINGS: Pharynx and larynx: Enlargement of the palatine tonsils. There is a 4 mm area of low attenuation on the right (series 2, image 35). There is also prominence of the lingual tonsils. Anteriorly, within the tongue base and floor of mouth, there is a 1.5 x 1.3 x 1.1 cm area of low attenuation (series 2, image 63) that probably communicates with a small area more superiorly on image 59. Mild thickening of the epiglottis. Mild thickening of the aryepiglottic folds with effacement of piriform sinuses. Larynx is unremarkable. Salivary glands: Parotid and submandibular glands are unremarkable. Thyroid: Normal. Lymph nodes: Top normal and mildly enlarged cervical nodes bilaterally. For example, right level 2 node measuring 2.2 cm (series 2, image 45); right level 2 node measuring 1.7 cm on image 56; 1.4 cm left level 2 node on image 53. Vascular: Major neck vessels are patent. Limited intracranial: No abnormal enhancement. Visualized orbits: Unremarkable. Mastoids and visualized paranasal sinuses: Mastoid air cells are clear. Paranasal sinus mucosal thickening. Skeleton: Minor degenerative changes are present primarily at C5-C6. Upper chest: Included lung apices are clear. Other: None. IMPRESSION: Enlargement of palatine and lingual tonsils. Areas of low attenuation extending anteriorly into the tongue base and floor of mouth and within the right palatine tonsil. There is epiglottic and aryepiglottic fold edema. Given provided history, this could reflect an infectious process with abscess formation. However, appearance is also suspicious for malignancy with necrosis, noting smoking history. Bilateral cervical adenopathy, which could be  reactive or metastatic. Electronically Signed   By: Macy Mis M.D.   On: 03/04/2021 18:15       Subjective: Pt swallowing has improved    Discharge Exam: Vitals:   03/05/21 1141 03/05/21 1607  BP: (!) 125/100 116/87  Pulse: 93 89  Resp: 18 18  Temp: (!) 97.5 F (36.4 C) 97.8 F (36.6 C)  SpO2: 95% 96%   Vitals:   03/05/21 0547 03/05/21 0836 03/05/21 1141 03/05/21 1607  BP: (!) 119/92 109/81 (!) 125/100 116/87  Pulse: 90 87 93 89  Resp: 16 16 18 18   Temp: 97.8 F (36.6 C) 98 F (36.7 C) (!) 97.5 F (36.4 C) 97.8 F (36.6 C)  TempSrc: Oral Oral Oral Oral  SpO2: 94% 94% 95% 96%  Weight:      Height:       (PE was done the morning of the day the pt left AMA)   General exam: Appears calm and comfortable  Respiratory system: Clear to auscultation. Respiratory effort normal. Cardiovascular system: S1 & S2 +. No  rubs, gallops or clicks.  Gastrointestinal system: Abdomen is nondistended, soft and nontender. Normal bowel sounds heard. Central nervous system: Alert and oriented. Moves all extremities  Psychiatry: Judgement and insight appear normal. Flat mood and affect    The results of significant diagnostics from this hospitalization (including imaging, microbiology, ancillary and laboratory) are listed below for reference.     Microbiology: Recent Results (from the past 240 hour(s))  Culture, blood (routine x 2)  Status: None   Collection Time: 03/04/21  4:48 PM   Specimen: BLOOD  Result Value Ref Range Status   Specimen Description BLOOD BLOOD RIGHT ARM  Final   Special Requests   Final    BOTTLES DRAWN AEROBIC AND ANAEROBIC Blood Culture adequate volume   Culture   Final    NO GROWTH 5 DAYS Performed at [facility], [address], [place], [place] [address]    Report Status 03/09/2021 FINAL  Final  Culture, blood (routine x 2)     Status: None   Collection Time: 03/04/21  4:52 PM   Specimen: BLOOD  Result Value Ref Range Status    Specimen Description   Final    BLOOD RIGHT ANTECUBITAL Performed at [facility], [address] [place], [place] [address]    Special Requests   Final    BOTTLES DRAWN AEROBIC AND ANAEROBIC Blood Culture results may not be optimal due to an excessive volume of blood received in culture bottles Performed at [facility], [address] [place], [place] [address]    Culture   Final    NO GROWTH 6 DAYS Performed at [facility], [place] [address] [place], [place] [address]    Report Status 03/10/2021 FINAL  Final  SARS CORONAVIRUS 2 (TAT 6-24 HRS) Nasopharyngeal Nasopharyngeal Swab     Status: None   Collection Time: 03/04/21  7:10 PM   Specimen: Nasopharyngeal Swab  Result Value Ref Range Status   SARS Coronavirus 2 NEGATIVE NEGATIVE Final    Comment: (NOTE) SARS-CoV-2 target nucleic acids are NOT DETECTED.  The SARS-CoV-2 RNA is generally detectable in upper and lower respiratory specimens during the acute phase of infection. Negative results do not preclude SARS-CoV-2 infection, do not rule out co-infections with other pathogens, and should not be used as the sole basis for treatment or other patient management decisions. Negative results must be combined with clinical observations, patient history, and epidemiological information. The expected result is Negative.  Fact Sheet for Patients: SugarRoll.be  Fact Sheet for Healthcare Providers: https://www.woods-mathews.com/  This test is not yet approved or cleared by the [place] FDA and  has been authorized for detection and/or diagnosis of SARS-CoV-2 by FDA under an Emergency Use Authorization (EUA). This EUA will remain  in effect (meaning this test can be used) for the duration of the COVID-19 declaration under Se ction 564(b)(1) of the Act, 21 U.S.C. section 360bbb-3(b)(1), unless the authorization is terminated or revoked sooner.  Performed at  [facility], [place] [address], [place], [place] [address]      Labs: BNP (last 3 results) No results for input(s): BNP in the last 8760 hours. Basic Metabolic Panel: No results for input(s): NA, K, CL, CO2, GLUCOSE, BUN, CREATININE, CALCIUM, MG, PHOS in the last 168 hours. Liver Function Tests: No results for input(s): AST, ALT, ALKPHOS, BILITOT, PROT, ALBUMIN in the last 168 hours. No results for input(s): LIPASE, AMYLASE in the last 168 hours. No results for input(s): AMMONIA in the last 168 hours. CBC: No results for input(s): WBC, NEUTROABS, HGB, HCT, MCV, PLT in the last 168 hours. Cardiac Enzymes: No results for input(s): CKTOTAL, CKMB, CKMBINDEX, TROPONINI in the last 168 hours. BNP: Invalid input(s): POCBNP CBG: No results for input(s): GLUCAP in the last 168 hours. D-Dimer No results for input(s): DDIMER in the last 72 hours. Hgb A1c No results for input(s): HGBA1C in the last 72 hours. Lipid Profile No results for input(s):  CHOL, HDL, LDLCALC, TRIG, CHOLHDL, LDLDIRECT in the last 72 hours. Thyroid function studies No results for input(s): TSH, T4TOTAL, T3FREE, THYROIDAB in the last 72 hours.  Invalid input(s): FREET3 Anemia work up No results for input(s): VITAMINB12, FOLATE, FERRITIN, TIBC, IRON, RETICCTPCT in the last 72 hours. Urinalysis No results found for: COLORURINE, APPEARANCEUR, [place], [place], [place], [place], [place], [place], [place], UROBILINOGEN, NITRITE, LEUKOCYTESUR Sepsis Labs Invalid input(s): PROCALCITONIN,  WBC,  LACTICIDVEN Microbiology Recent Results (from the past 240 hour(s))  Culture, blood (routine x 2)     Status: None   Collection Time: 03/04/21  4:48 PM   Specimen: BLOOD  Result Value Ref Range Status   Specimen Description BLOOD BLOOD RIGHT ARM  Final   Special Requests   Final    BOTTLES DRAWN AEROBIC AND ANAEROBIC Blood Culture adequate volume   Culture   Final    NO GROWTH 5 DAYS Performed at [facility], [address] [place], [place] [address]    Report Status 03/09/2021 FINAL  Final  Culture, blood (routine x 2)     Status: None   Collection Time: 03/04/21  4:52 PM   Specimen: BLOOD  Result Value Ref Range Status   Specimen Description   Final    BLOOD RIGHT ANTECUBITAL Performed at [facility], [address] [place], [place] [address]    Special Requests   Final    BOTTLES DRAWN AEROBIC AND ANAEROBIC Blood Culture results may not be optimal due to an excessive volume of blood received in culture bottles Performed at [facility], [address], [place], [place] [address]    Culture   Final    NO GROWTH 6 DAYS Performed at [facility], [place] [address] [place], [place] [address]    Report Status 03/10/2021 FINAL  Final  SARS CORONAVIRUS 2 (TAT 6-24 HRS) Nasopharyngeal Nasopharyngeal Swab     Status: None   Collection Time: 03/04/21  7:10 PM   Specimen: Nasopharyngeal Swab  Result Value Ref Range Status   SARS Coronavirus 2 NEGATIVE NEGATIVE Final    Comment: (NOTE) SARS-CoV-2 target nucleic acids are NOT DETECTED.  The SARS-CoV-2 RNA is generally detectable in upper and lower respiratory specimens during the acute phase of infection. Negative results do not preclude SARS-CoV-2 infection, do not rule out co-infections with other pathogens, and should not be used as the sole basis for treatment or other patient management decisions. Negative results must be combined with clinical observations, patient history, and epidemiological information. The expected result is Negative.  Fact Sheet for Patients: SugarRoll.be  Fact Sheet for Healthcare Providers: https://www.woods-mathews.com/  This test is not yet approved or cleared by the [place] FDA and  has been authorized for detection and/or diagnosis of SARS-CoV-2 by FDA under an Emergency Use Authorization (EUA). This EUA will remain  in  effect (meaning this test can be used) for the duration of the COVID-19 declaration under Se ction 564(b)(1) of the Act, 21 U.S.C. section 360bbb-3(b)(1), unless the authorization is terminated or revoked sooner.  Performed at [facility], [place] [address], [place], [place] [address]      Time coordinating discharge: Over 30 minutes  SIGNED:   Wyvonnia Dusky, MD  [facility] 03/13/2021, 11:23 AM Pager   If 7PM-7AM, please contact night-coverage

## 2021-05-29 ENCOUNTER — Ambulatory Visit (INDEPENDENT_AMBULATORY_CARE_PROVIDER_SITE_OTHER): Payer: Self-pay | Admitting: Dermatology

## 2021-05-29 ENCOUNTER — Other Ambulatory Visit: Payer: Self-pay

## 2021-05-29 DIAGNOSIS — L720 Epidermal cyst: Secondary | ICD-10-CM

## 2021-05-29 DIAGNOSIS — L578 Other skin changes due to chronic exposure to nonionizing radiation: Secondary | ICD-10-CM

## 2021-05-29 DIAGNOSIS — D485 Neoplasm of uncertain behavior of skin: Secondary | ICD-10-CM

## 2021-05-29 DIAGNOSIS — L821 Other seborrheic keratosis: Secondary | ICD-10-CM

## 2021-05-29 DIAGNOSIS — Z85828 Personal history of other malignant neoplasm of skin: Secondary | ICD-10-CM

## 2021-05-29 NOTE — Progress Notes (Signed)
   Follow-Up Visit   Subjective  Trevor Estrada is a 45 y.o. male who presents for the following: Growth (Left ear x 4-5 months, growing) and Mole (Left temple x 3-4 months). Patient concerned since he has a history of BCCs.   The following portions of the chart were reviewed this encounter and updated as appropriate:       Review of Systems:  No other skin or systemic complaints except as noted in HPI or Assessment and Plan.  Objective  Well appearing patient in no apparent distress; mood and affect are within normal limits.  A focused examination was performed including face. Relevant physical exam findings are noted in the Assessment and Plan.  Left Temple Tiny stuck-on, waxy, tan-brown papule-- Discussed benign etiology and prognosis.   L post upper ear helix 1.0 cm firm white SQ nodule   Assessment & Plan  Seborrheic keratosis Left Temple  Reassured benign age-related growth.  Recommend observation.  Discussed cryotherapy if spot(s) become irritated or inflamed.    Neoplasm of uncertain behavior of skin L post upper ear helix  Epidermal / dermal shaving  Lesion diameter (cm):  1 Informed consent: discussed and consent obtained   Patient was prepped and draped in usual sterile fashion: Area prepped with alcohol. Anesthesia: the lesion was anesthetized in a standard fashion   Anesthetic:  1% lidocaine w/ epinephrine 1-100,000 buffered w/ 8.4% NaHCO3 Instrument used: flexible razor blade and scissors   Hemostasis achieved with: pressure, aluminum chloride and electrodesiccation   Outcome: patient tolerated procedure well   Post-procedure details: wound care instructions given   Post-procedure details comment:  Ointment and small bandage applied  Specimen 1 - Surgical pathology Differential Diagnosis: Irritated Cyst vs other Check Margins: No 1.0 cm firm white SQ nodule  Actinic Damage - chronic, secondary to cumulative UV radiation exposure/sun exposure  over time - diffuse scaly erythematous macules with underlying dyspigmentation - Recommend daily broad spectrum sunscreen SPF 30+ to sun-exposed areas, reapply every 2 hours as needed.  - Recommend staying in the shade or wearing long sleeves, sun glasses (UVA+UVB protection) and wide brim hats (4-inch brim around the entire circumference of the hat). - Call for new or changing lesions.   Return if symptoms worsen or fail to improve.  IJamesetta Orleans, CMA, am acting as scribe for Brendolyn Patty, MD .  Documentation: I have reviewed the above documentation for accuracy and completeness, and I agree with the above.  Brendolyn Patty MD

## 2021-05-29 NOTE — Patient Instructions (Addendum)
Wound Care Instructions  Cleanse wound gently with soap and water once a day then pat dry with clean gauze. Apply a thing coat of Petrolatum (petroleum jelly, "Vaseline") over the wound (unless you have an allergy to this). We recommend that you use a new, sterile tube of Vaseline. Do not pick or remove scabs. Do not remove the yellow or white "healing tissue" from the base of the wound.  Cover the wound with fresh, clean, nonstick gauze and secure with paper tape. You may use Band-Aids in place of gauze and tape if the would is small enough, but would recommend trimming much of the tape off as there is often too much. Sometimes Band-Aids can irritate the skin.  You should call the office for your biopsy report after 1 week if you have not already been contacted.  If you experience any problems, such as abnormal amounts of bleeding, swelling, significant bruising, significant pain, or evidence of infection, please call the office immediately.  FOR ADULT SURGERY PATIENTS: If you need something for pain relief you may take 1 extra strength Tylenol (acetaminophen) AND 2 Ibuprofen (200mg each) together every 4 hours as needed for pain. (do not take these if you are allergic to them or if you have a reason you should not take them.) Typically, you may only need pain medication for 1 to 3 days.   If you have any questions or concerns for your doctor, please call our main line at [phone] and press option 4 to reach your doctor's medical assistant. If no one answers, please leave a voicemail as directed and we will return your call as soon as possible. Messages left after 4 pm will be answered the following business day.   You may also send us a message via MyChart. We typically respond to MyChart messages within 1-2 business days.  For prescription refills, please ask your pharmacy to contact our office. Our fax number is [phone].  If you have an urgent issue when the clinic is closed that  cannot wait until the next business day, you can page your doctor at the number below.    Please note that while we do our best to be available for urgent issues outside of office hours, we are not available 24/7.   If you have an urgent issue and are unable to reach us, you may choose to seek medical care at your doctor's office, retail clinic, urgent care center, or emergency room.  If you have a medical emergency, please immediately call 911 or go to the emergency department.  Pager Numbers  - Dr. Kowalski: [phone]  - Dr. Moye: [phone]  - Dr. Stewart: [phone]  In the event of inclement weather, please call our main line at [phone] for an update on the status of any delays or closures.  Dermatology Medication Tips: Please keep the boxes that topical medications come in in order to help keep track of the instructions about where and how to use these. Pharmacies typically print the medication instructions only on the boxes and not directly on the medication tubes.   If your medication is too expensive, please contact our office at [phone] option 4 or send us a message through MyChart.   We are unable to tell what your co-pay for medications will be in advance as this is different depending on your insurance coverage. However, we may be able to find a substitute medication at lower cost or fill out paperwork to get insurance to cover a needed   medication.   If a prior authorization is required to get your medication covered by your insurance company, please allow us 1-2 business days to complete this process.  Drug prices often vary depending on where the prescription is filled and some pharmacies may offer cheaper prices.  The website www.goodrx.com contains coupons for medications through different pharmacies. The prices here do not account for what the cost may be with help from insurance (it may be cheaper with your insurance), but the website can give you the  price if you did not use any insurance.  - You can print the associated coupon and take it with your prescription to the pharmacy.  - You may also stop by our office during regular business hours and pick up a GoodRx coupon card.  - If you need your prescription sent electronically to a different pharmacy, notify our office through Long MyChart or by phone at [phone] option 4.   

## 2021-06-05 ENCOUNTER — Telehealth: Payer: Self-pay

## 2021-06-05 NOTE — Telephone Encounter (Signed)
-----   Message from Brendolyn Patty, MD sent at 06/04/2021  7:46 PM EDT ----- Skin , l post upper ear helix EPIDERMAL INCLUSION CYST  Benign cyst - please call patient

## 2021-06-05 NOTE — Telephone Encounter (Signed)
Left pt message advising bx results./sh

## 2021-07-16 ENCOUNTER — Other Ambulatory Visit: Payer: Self-pay

## 2021-07-16 ENCOUNTER — Ambulatory Visit (INDEPENDENT_AMBULATORY_CARE_PROVIDER_SITE_OTHER): Payer: Self-pay | Admitting: Dermatology

## 2021-07-16 DIAGNOSIS — D492 Neoplasm of unspecified behavior of bone, soft tissue, and skin: Secondary | ICD-10-CM

## 2021-07-16 DIAGNOSIS — D239 Other benign neoplasm of skin, unspecified: Secondary | ICD-10-CM

## 2021-07-16 DIAGNOSIS — L578 Other skin changes due to chronic exposure to nonionizing radiation: Secondary | ICD-10-CM

## 2021-07-16 DIAGNOSIS — Z85828 Personal history of other malignant neoplasm of skin: Secondary | ICD-10-CM

## 2021-07-16 DIAGNOSIS — Z808 Family history of malignant neoplasm of other organs or systems: Secondary | ICD-10-CM

## 2021-07-16 DIAGNOSIS — D225 Melanocytic nevi of trunk: Secondary | ICD-10-CM

## 2021-07-16 DIAGNOSIS — L820 Inflamed seborrheic keratosis: Secondary | ICD-10-CM

## 2021-07-16 HISTORY — DX: Other benign neoplasm of skin, unspecified: D23.9

## 2021-07-16 NOTE — Progress Notes (Signed)
Follow-Up Visit   Subjective  Trevor Estrada is a 45 y.o. male who presents for the following: Skin Problem (Check a few spots on the chest and back changing and growing, hx of BCC ). Fx hx of Melanoma   The following portions of the chart were reviewed this encounter and updated as appropriate:   Tobacco  Allergies  Meds  Problems  Med Hx  Surg Hx  Fam Hx      Review of Systems:  No other skin or systemic complaints except as noted in HPI or Assessment and Plan.  Objective  Well appearing patient in no apparent distress; mood and affect are within normal limits.  A focused examination was performed including face.chest,back. Relevant physical exam findings are noted in the Assessment and Plan. Patient declined FBSE.   right superior chest Pink papule   upper back right of midline 0.4 cm irregular papule        left posterior shoulder Erythematous keratotic or waxy stuck-on papule or plaque.    Assessment & Plan  Neoplasm of skin (2) right superior chest  upper back right of midline  Epidermal / dermal shaving  Lesion diameter (cm):  0.4 Informed consent: discussed and consent obtained   Timeout: patient name, date of birth, surgical site, and procedure verified   Procedure prep:  Patient was prepped and draped in usual sterile fashion Prep type:  Isopropyl alcohol Anesthesia: the lesion was anesthetized in a standard fashion   Anesthetic:  1% lidocaine w/ epinephrine 1-100,000 buffered w/ 8.4% NaHCO3 Hemostasis achieved with: pressure, aluminum chloride and electrodesiccation   Outcome: patient tolerated procedure well   Post-procedure details: sterile dressing applied and wound care instructions given   Dressing type: bandage and petrolatum    Specimen 1 - Surgical pathology Differential Diagnosis: R/O Dysplastic nevus   Check Margins: No  Chest - Favor resolving inflammatory papule in the center of previous BCC scar at chest. Benign  appearing today. Call if not resolved in 3-4 weeks or if worsens.  Back - r/o dysplastic nevus  Inflamed seborrheic keratosis left posterior shoulder  Benign-appearing. Discussed cryotherapy - deferred today  Recommend OTC Gold Bond Rapid Relief Anti-Itch cream (pramoxine + menthol), CeraVe Anti-itch cream or lotion (pramoxine), Sarna lotion (Original- menthol + camphor or Sensitive- pramoxine) or Eucerin 12 hour Itch Relief lotion (menthol) up to 3 times per day to areas on body that are itchy.   Actinic Damage - Severe, confluent actinic changes with pre-cancerous actinic keratoses  - Severe, chronic, not at goal, secondary to cumulative UV radiation exposure over time - diffuse scaly erythematous macules and papules with underlying dyspigmentation - Discussed Prescription "Field Treatment" for Severe, Chronic Confluent Actinic Changes with Pre-Cancerous Actinic Keratoses  Field treatment involves treatment of an entire area of skin that has confluent Actinic Changes (Sun/ Ultraviolet light damage) and PreCancerous Actinic Keratoses by method of PhotoDynamic Therapy (PDT) and/or prescription Topical Chemotherapy agents such as 5-fluorouracil, 5-fluorouracil/calcipotriene, and/or imiquimod.  The purpose is to decrease the number of clinically evident and subclinical PreCancerous lesions to prevent progression to development of skin cancer by chemically destroying early precancer changes that may or may not be visible.  It has been shown to reduce the risk of developing skin cancer in the treated area. As a result of treatment, redness, scaling, crusting, and open sores may occur during treatment course. One or more than one of these methods may be used and may have to be used several times to control,  suppress and eliminate the PreCancerous changes. Discussed treatment course, expected reaction, and possible side effects. - Recommend daily broad spectrum sunscreen SPF 30+ to sun-exposed areas,  reapply every 2 hours as needed.  - Staying in the shade or wearing long sleeves, sun glasses (UVA+UVB protection) and wide brim hats (4-inch brim around the entire circumference of the hat) are also recommended. - Call for new or changing lesions.  - Start 5-fluorouracil/calcipotriene cream twice a day for 4 days to affected areas including face. Prescription sent to [facility]. Patient advised they will receive an email to purchase the medication online and have it sent to their home. Patient provided with handout reviewing treatment course and side effects and advised to call or message [place] on MyChart with any concerns.   Return 1 year for FBSE; sooner for new or changing lesions  I, Marye Round, CMA, am acting as scribe for Forest Gleason, MD .   Documentation: I have reviewed the above documentation for accuracy and completeness, and I agree with the above.  Forest Gleason, MD

## 2021-07-16 NOTE — Patient Instructions (Addendum)
Melanoma ABCDEs  Melanoma is the most dangerous type of skin cancer, and is the leading cause of death from skin disease.  You are more likely to develop melanoma if you: Have light-colored skin, light-colored eyes, or red or blond hair Spend a lot of time in the sun Tan regularly, either outdoors or in a tanning bed Have had blistering sunburns, especially during childhood Have a close family member who has had a melanoma Have atypical moles or large birthmarks  Early detection of melanoma is key since treatment is typically straightforward and cure rates are extremely high if we catch it early.   The first sign of melanoma is often a change in a mole or a new dark spot.  The ABCDE system is a way of remembering the signs of melanoma.  A for asymmetry:  The two halves do not match. B for border:  The edges of the growth are irregular. C for color:  A mixture of colors are present instead of an even brown color. D for diameter:  Melanomas are usually (but not always) greater than 95mm - the size of a pencil eraser. E for evolution:  The spot keeps changing in size, shape, and color.  Please check your skin once per month between visits. You can use a small mirror in front and a large mirror behind you to keep an eye on the back side or your body.   If you see any new or changing lesions before your next follow-up, please call to schedule a visit.  Please continue daily skin protection including broad spectrum sunscreen SPF 30+ to sun-exposed areas, reapplying every 2 hours as needed when you're outdoors.          5-Fluorouracil/Calcipotriene Patient Education  Apply to face twice a day for 4 days, apply to upper mid back spot (if still there) twice a day for 7 days. If after 4-6 weeks these spots are still scaly, you can repeat the cream again.  Actinic keratoses are the dry, red scaly spots on the skin caused by sun damage. A portion of these spots can turn into skin cancer with  time, and treating them can help prevent development of skin cancer.   Treatment of these spots requires removal of the defective skin cells. There are various ways to remove actinic keratoses, including freezing with liquid nitrogen, treatment with creams, or treatment with a blue light procedure in the office.   5-fluorouracil cream is a topical cream used to treat actinic keratoses. It works by interfering with the growth of abnormal fast-growing skin cells, such as actinic keratoses. These cells peel off and are replaced by healthy ones.   5-fluorouracil/calcipotriene is a combination of the 5-fluorouracil cream with a vitamin D analog cream called calcipotriene. The calcipotriene alone does not treat actinic keratoses. However, when it is combined with 5-fluorouracil, it helps the 5-fluorouracil treat the actinic keratoses much faster so that the same results can be achieved with a much shorter treatment time.  INSTRUCTIONS FOR 5-FLUOROURACIL/CALCIPOTRIENE CREAM:   5-fluorouracil/calcipotriene cream typically only needs to be used for 4-7 days. A thin layer should be applied twice a day to the treatment areas recommended by your physician.   If your physician prescribed you separate tubes of 5-fluourouracil and calcipotriene, apply a thin layer of 5-fluorouracil followed by a thin layer of calcipotriene.   Avoid contact with your eyes, nostrils, and mouth. Do not use 5-fluorouracil/calcipotriene cream on infected or open wounds.   You will develop redness,  irritation and some crusting at areas where you have pre-cancer damage/actinic keratoses. IF YOU DEVELOP PAIN, BLEEDING, OR SIGNIFICANT CRUSTING, STOP THE TREATMENT EARLY - you have already gotten a good response and the actinic keratoses should clear up well.  Wash your hands after applying 5-fluorouracil 5% cream on your skin.   A moisturizer or sunscreen with a minimum SPF 30 should be applied each morning.   Once you have finished  the treatment, you can apply a thin layer of Vaseline twice a day to irritated areas to soothe and calm the areas more quickly. If you experience significant discomfort, contact your physician.  For some patients it is necessary to repeat the treatment for best results.  SIDE EFFECTS: When using 5-fluorouracil/calcipotriene cream, you may have mild irritation, such as redness, dryness, swelling, or a mild burning sensation. This usually resolves within 2 weeks. The more actinic keratoses you have, the more redness and inflammation you can expect during treatment. Eye irritation has been reported rarely. If this occurs, please let us know.  If you have any trouble using this cream, please call the office. If you have any other questions about this information, please do not hesitate to ask me before you leave the office.     Recommend OTC Gold Bond Rapid Relief Anti-Itch cream (pramoxine + menthol), CeraVe Anti-itch cream or lotion (pramoxine), Sarna lotion (Original- menthol + camphor or Sensitive- pramoxine) or Eucerin 12 hour Itch Relief lotion (menthol) up to 3 times per day to areas on body that are itchy.      Wound Care Instructions  Cleanse wound gently with soap and water once a day then pat dry with clean gauze. Apply a thing coat of Petrolatum (petroleum jelly, "Vaseline") over the wound (unless you have an allergy to this). We recommend that you use a new, sterile tube of Vaseline. Do not pick or remove scabs. Do not remove the yellow or white "healing tissue" from the base of the wound.  Cover the wound with fresh, clean, nonstick gauze and secure with paper tape. You may use Band-Aids in place of gauze and tape if the would is small enough, but would recommend trimming much of the tape off as there is often too much. Sometimes Band-Aids can irritate the skin.  You should call the office for your biopsy report after 1 week if you have not already been contacted.  If you experience  any problems, such as abnormal amounts of bleeding, swelling, significant bruising, significant pain, or evidence of infection, please call the office immediately.  FOR ADULT SURGERY PATIENTS: If you need something for pain relief you may take 1 extra strength Tylenol (acetaminophen) AND 2 Ibuprofen (200mg  each) together every 4 hours as needed for pain. (do not take these if you are allergic to them or if you have a reason you should not take them.) Typically, you may only need pain medication for 1 to 3 days.       If you have any questions or concerns for your doctor, please call our main line at [phone] and press option 4 to reach your doctor's medical assistant. If no one answers, please leave a voicemail as directed and we will return your call as soon as possible. Messages left after 4 pm will be answered the following business day.   You may also send [place] a message via [place]. We typically respond to MyChart messages within 1-2 business days.  For prescription refills, please ask your pharmacy to contact our  office. Our fax number is [phone].  If you have an urgent issue when the clinic is closed that cannot wait until the next business day, you can page your doctor at the number below.    Please note that while we do our best to be available for urgent issues outside of office hours, we are not available 24/7.   If you have an urgent issue and are unable to reach [place], you may choose to seek medical care at your doctor's office, retail clinic, urgent care center, or emergency room.  If you have a medical emergency, please immediately call 911 or go to the emergency department.  Pager Numbers  - Dr. Nehemiah Massed: [phone]  - Dr. Laurence Ferrari: [phone]  - Dr. Nicole Kindred: [phone]  In the event of inclement weather, please call our main line at [phone] for an update on the status of any delays or closures.  Dermatology Medication Tips: Please keep the boxes that  topical medications come in in order to help keep track of the instructions about where and how to use these. Pharmacies typically print the medication instructions only on the boxes and not directly on the medication tubes.   If your medication is too expensive, please contact our office at [phone] option 4 or send [place] a message through [place].   We are unable to tell what your co-pay for medications will be in advance as this is different depending on your insurance coverage. However, we may be able to find a substitute medication at lower cost or fill out paperwork to get insurance to cover a needed medication.   If a prior authorization is required to get your medication covered by your insurance company, please allow [place] 1-2 business days to complete this process.  Drug prices often vary depending on where the prescription is filled and some pharmacies may offer cheaper prices.  The website www.goodrx.com contains coupons for medications through different pharmacies. The prices here do not account for what the cost may be with help from insurance (it may be cheaper with your insurance), but the website can give you the price if you did not use any insurance.  - You can print the associated coupon and take it with your prescription to the pharmacy.  - You may also stop by our office during regular business hours and pick up a GoodRx coupon card.  - If you need your prescription sent electronically to a different pharmacy, notify our office through [facility] or by phone at [phone] option 4.

## 2021-07-18 ENCOUNTER — Telehealth: Payer: Self-pay

## 2021-07-18 NOTE — Telephone Encounter (Signed)
Pt called, I discussed biopsy results with this pt, pt would like to know the out of pocket charge for excision of severe atypia nevus, discussed with pt I will get in touch with with our coding department and call him Monday with this information. I also told pt he should contact [facility] financial assistance.

## 2021-07-18 NOTE — Telephone Encounter (Signed)
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   Excision would be typical standard of care of this. If too expensive, would recommend reaching out to [place] assistance to see if he could get into the program to help with costs.  At a minimum if unable to get excision, would do a larger shave removal including an area of normal looking skin to have more confidence of removing the entire thing.  This is a SEVERELY ATYPICAL MOLE. On the spectrum from normal mole to melanoma skin cancer, this is in between the two but closer towards a melanoma skin cancer.  - The treatment of choice for severely atypical moles is to cut them out in clinic with an area of normal looking skin around them to get all the atypical cells out. The skin that is removed will be sent to check under the microscope again to be sure it looks completely out.   - People who have a history of atypical moles do have a slightly increased risk of developing melanoma somewhere on the body, so a full body skin exam by a dermatologist is recommended at least once a year. - Monthly self skin checks and daily sun protection are also recommended.  - Please also call if you notice any new or changing spots anywhere else on the body before your follow-up visit.    Dr. Jerilynn Mages called 07/18/2021, no answer, left voicemail to call back.   MAs please call. Thank you!

## 2021-07-18 NOTE — Telephone Encounter (Signed)
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   Excision would be typical standard of care of this. If too expensive, would recommend reaching out to [place] assistance to see if he could get into the program to help with costs.  At a minimum if unable to get excision, would do a larger shave removal including an area of normal looking skin to have more confidence of removing the entire thing.  This is a SEVERELY ATYPICAL MOLE. On the spectrum from normal mole to melanoma skin cancer, this is in between the two but closer towards a melanoma skin cancer.  - The treatment of choice for severely atypical moles is to cut them out in clinic with an area of normal looking skin around them to get all the atypical cells out. The skin that is removed will be sent to check under the microscope again to be sure it looks completely out.   - People who have a history of atypical moles do have a slightly increased risk of developing melanoma somewhere on the body, so a full body skin exam by a dermatologist is recommended at least once a year. - Monthly self skin checks and daily sun protection are also recommended.  - Please also call if you notice any new or changing spots anywhere else on the body before your follow-up visit.    Dr. Jerilynn Mages called 07/18/2021, no answer, left voicemail to call back.   MAs please call. Thank you!

## 2021-07-23 NOTE — Telephone Encounter (Signed)
Hi Shamika and Lhanie, Could either of you give [place] the current self-pay cost for  CPT 11402 CPT 11602 CPT 12032 and CPT 13101?  Thank you!

## 2021-07-23 NOTE — Telephone Encounter (Signed)
  Procedure code Self pay fees CPT 11402       $255.85 CPT 41712  $354.75 CPT 78718   $507.40 CPT 13101  $66.50

## 2021-07-24 NOTE — Telephone Encounter (Signed)
Thank you. Can you double check the price for CPT 13101? It should be complex repair on the trunk so should be more than 12032 (intermediate repair of the trunk). Thank you!

## 2021-07-24 NOTE — Telephone Encounter (Signed)
$  666.50 is the correct amount for CPT 13101

## 2021-07-24 NOTE — Telephone Encounter (Signed)
Shamika let me know the 13101 is $666.50.  So the estimate for the patient is between $763.25 and $1021.25 for the surgery plus the [facility] pathology fee ($60-65) depending on how complex the closure ends up being and whether there is any cancer seen under the microscope when we send the surgical specimen. I expect it will likely be closer to the lower number but can't predict with certainty.   [place], could you please update the patient? Thank you!

## 2021-07-25 NOTE — Telephone Encounter (Signed)
Hi Niki, Can you call patient with update? Thank you!

## 2021-07-25 NOTE — Telephone Encounter (Signed)
Patient called again today wanting surgery pricing. I gave him the information below per the insurance dept and Dr. Jerilynn Mages.  Patient also states he never received [facility] cream. Patient profile created and RX sent in.

## 2021-07-25 NOTE — Telephone Encounter (Signed)
Patient did schedule for January.

## 2021-07-25 NOTE — Telephone Encounter (Signed)
Thank you. Did he want to schedule or is he going to talk with the [place] aid office to see if they can help? Thank you!

## 2021-07-25 NOTE — Telephone Encounter (Signed)
Ok great. Thank you

## 2021-07-25 NOTE — Telephone Encounter (Signed)
CPT 13101 self pay is $666.50

## 2021-07-29 ENCOUNTER — Encounter: Payer: Self-pay | Admitting: Dermatology

## 2021-10-23 ENCOUNTER — Encounter: Payer: Self-pay | Admitting: Dermatology

## 2021-10-23 ENCOUNTER — Other Ambulatory Visit: Payer: Self-pay

## 2021-10-23 ENCOUNTER — Ambulatory Visit (INDEPENDENT_AMBULATORY_CARE_PROVIDER_SITE_OTHER): Payer: Self-pay | Admitting: Dermatology

## 2021-10-23 DIAGNOSIS — D485 Neoplasm of uncertain behavior of skin: Secondary | ICD-10-CM

## 2021-10-23 MED ORDER — MUPIROCIN 2 % EX OINT: 1.0000 "application " | TOPICAL_OINTMENT | Freq: Every day | CUTANEOUS | 0 refills | Status: DC

## 2021-10-23 NOTE — Patient Instructions (Signed)

## 2021-10-23 NOTE — Progress Notes (Signed)
Follow-Up Visit   Subjective  Trevor Estrada is a 46 y.o. male who presents for the following: Procedure (Patient here today for excision of bx proven DYSPLASTIC COMPOUND NEVUS WITH SEVERE ATYPIA, CLOSE TO MARGIN at upper back right of midline. ).  The following portions of the chart were reviewed this encounter and updated as appropriate:   Tobacco   Allergies   Meds   Problems   Med Hx   Surg Hx   Fam Hx       Review of Systems:  No other skin or systemic complaints except as noted in HPI or Assessment and Plan.  Objective  Well appearing patient in no apparent distress; mood and affect are within normal limits.  A focused examination was performed including face, back. Relevant physical exam findings are noted in the Assessment and Plan.  Upper Back Right of Midline Pink scar    Assessment & Plan  Neoplasm of uncertain behavior of skin Upper Back Right of Midline  Skin excision  Lesion length (cm):  0.9 Margin per side (cm):  0.5 Total excision diameter (cm):  1.9 Informed consent: discussed and consent obtained   Timeout: patient name, date of birth, surgical site, and procedure verified   Procedure prep:  Patient was prepped and draped in usual sterile fashion Prep type:  Povidone-iodine Anesthesia: the lesion was anesthetized in a standard fashion   Anesthetic:  1% lidocaine w/ epinephrine 1-100,000 buffered w/ 8.4% NaHCO3 (3cc lido w/epi, 9cc 0.25% bupivicaine) Instrument used: #10 blade   Hemostasis achieved with: suture, pressure and electrodesiccation   Outcome: patient tolerated procedure well with no complications   Post-procedure details: wound care instructions given   Additional details:  Mupirocin and a pressure dressing applied  Skin repair Complexity:  Complex Final length (cm):  5.3 Informed consent: discussed and consent obtained   Timeout: patient name, date of birth, surgical site, and procedure verified   Procedure prep:  Patient was  prepped and draped in usual sterile fashion Prep type:  Chlorhexidine Anesthesia: the lesion was anesthetized in a standard fashion   Anesthetic:  1% lidocaine w/ epinephrine 1-100,000 local infiltration Reason for type of repair: reduce tension to allow closure, reduce the risk of dehiscence, infection, and necrosis, reduce subcutaneous dead space and avoid a hematoma, allow closure of the large defect, allow side-to-side closure without requiring a flap or graft and enhance both functionality and cosmetic results   Undermining: area extensively undermined   Subcutaneous layers (deep stitches):  Suture size:  3-0 and 2-0 Suture type: Vicryl (polyglactin 910) and PDS (polydioxanone)   Stitches:  Buried vertical mattress Fine/surface layer approximation (top stitches):  Suture size:  4-0 Suture type: Prolene (polypropylene)   Suture removal (days):  14 Hemostasis achieved with: suture, pressure and electrodesiccation Outcome: patient tolerated procedure well with no complications   Post-procedure details: wound care instructions given   Additional details:  Extensive undermining greater than the maximum width of the defect along at least one entire edge of the defect was performed Maximum width of defect perpendicular to the line of the closure 1.7 cm Width of undermining done 2.1 cm  Mupirocin and a pressure bandage applied   mupirocin ointment (BACTROBAN) 2 % Apply 1 application topically daily. With dressing changes  Specimen 1 - Surgical pathology Differential Diagnosis: BX proven DYSPLASTIC COMPOUND NEVUS WITH SEVERE ATYPIA, CLOSE TO MARGIN  Check Margins: yes Healing pink biopsy site [id number] Tagged at 3 o'clock     Return for  Suture Removal 10-14 days.  Graciella Belton, RMA, am acting as scribe for Forest Gleason, MD .  Documentation: I have reviewed the above documentation for accuracy and completeness, and I agree with the above.  Forest Gleason, MD

## 2021-10-24 ENCOUNTER — Telehealth: Payer: Self-pay

## 2021-10-24 NOTE — Telephone Encounter (Signed)
Called patient to check on him after yesterday's surgery, LVM for patient to call if any concerns.Cherly Hensen

## 2021-10-30 ENCOUNTER — Telehealth: Payer: Self-pay

## 2021-10-30 NOTE — Telephone Encounter (Addendum)
Tried calling patient regarding results. No answer. LMOM for patient to return call.     ----- Message from Alfonso Patten, MD sent at 10/30/2021  9:07 AM EST ----- Skin (M), upper back right of midline NO RESIDUAL DYSPLASTIC NEVUS, MARGINS FREE  Entire lesion appears to be out. No additional treatment needed at this time. Please call our office [phone] with any questions.   MAs please call. Thank you!

## 2021-10-30 NOTE — Telephone Encounter (Signed)
Patient called yesterday to cancel appointment. I did call and left msg yesterday PM to confirm he had someone to remove stitches. Patient left nurse VM today that he did have someone remove stitches for him.

## 2021-10-31 ENCOUNTER — Telehealth: Payer: Self-pay

## 2021-10-31 NOTE — Telephone Encounter (Signed)
LVM for pt to call office for results.Trevor Estrada

## 2021-10-31 NOTE — Telephone Encounter (Signed)
Patient advised of BX results .aw 

## 2021-11-05 ENCOUNTER — Ambulatory Visit: Payer: Self-pay | Admitting: Dermatology

## 2021-11-06 ENCOUNTER — Ambulatory Visit (INDEPENDENT_AMBULATORY_CARE_PROVIDER_SITE_OTHER): Payer: Self-pay | Admitting: Dermatology

## 2021-11-06 ENCOUNTER — Other Ambulatory Visit: Payer: Self-pay

## 2021-11-06 DIAGNOSIS — Z4802 Encounter for removal of sutures: Secondary | ICD-10-CM

## 2021-11-06 NOTE — Progress Notes (Signed)
° °  Follow-Up Visit   Subjective  Trevor Estrada is a 46 y.o. male who presents for the following: Follow-up (Patient here today for suture removal at upper back right of midline for bx proven severe dysplastic nevus. Patient had someone try to take them out yesterday but the suture broke and they did not all come out. ).  The following portions of the chart were reviewed this encounter and updated as appropriate:   Tobacco   Allergies   Meds   Problems   Med Hx   Surg Hx   Fam Hx       Review of Systems:  No other skin or systemic complaints except as noted in HPI or Assessment and Plan.  Objective  Well appearing patient in no apparent distress; mood and affect are within normal limits.  A focused examination was performed including back. Relevant physical exam findings are noted in the Assessment and Plan.    Assessment & Plan   Encounter for Removal of Sutures - Incision site at the back is clean, dry and intact - Wound cleansed, sutures removed, wound cleansed. No steristrips needed.  - Discussed pathology results showing no residual dysplastic nevus - Scars remodel for a full year. - Once steri-strips fall off, patient can apply over-the-counter silicone scar cream each night to help with scar remodeling if desired. - Patient advised to call with any concerns or if they notice any new or changing lesions.  Recommend FBSE next available and yearly given history. Recommend patient talk with social work to apply for charity care/financial assistance.  Graciella Belton, RMA, am acting as scribe for Forest Gleason, MD .  Documentation: I have reviewed the above documentation for accuracy and completeness, and I agree with the above.  Forest Gleason, MD

## 2021-11-08 ENCOUNTER — Encounter: Payer: Self-pay | Admitting: Dermatology

## 2022-06-17 ENCOUNTER — Ambulatory Visit: Payer: BLUE CROSS/BLUE SHIELD | Admitting: Dermatology

## 2022-06-17 ENCOUNTER — Encounter: Payer: Self-pay | Admitting: Dermatology

## 2022-06-17 DIAGNOSIS — L821 Other seborrheic keratosis: Secondary | ICD-10-CM | POA: Diagnosis not present

## 2022-06-17 DIAGNOSIS — D492 Neoplasm of unspecified behavior of bone, soft tissue, and skin: Secondary | ICD-10-CM

## 2022-06-17 DIAGNOSIS — B078 Other viral warts: Secondary | ICD-10-CM

## 2022-06-17 DIAGNOSIS — Z86018 Personal history of other benign neoplasm: Secondary | ICD-10-CM

## 2022-06-17 DIAGNOSIS — Z85828 Personal history of other malignant neoplasm of skin: Secondary | ICD-10-CM

## 2022-06-17 DIAGNOSIS — D229 Melanocytic nevi, unspecified: Secondary | ICD-10-CM

## 2022-06-17 DIAGNOSIS — T148XXA Other injury of unspecified body region, initial encounter: Secondary | ICD-10-CM | POA: Diagnosis not present

## 2022-06-17 DIAGNOSIS — D225 Melanocytic nevi of trunk: Secondary | ICD-10-CM | POA: Diagnosis not present

## 2022-06-17 MED ORDER — MUPIROCIN 2 % EX OINT: TOPICAL_OINTMENT | CUTANEOUS | 0 refills | Status: AC

## 2022-06-17 NOTE — Patient Instructions (Addendum)
Cryotherapy Aftercare  Wash gently with soap and water everyday.   Apply Vaseline daily until healed.   Nose: Apply Mupirocin twice daily. Avoid scratching/picking at area.   Wound Care Instructions  Cleanse wound gently with soap and water once a day then pat dry with clean gauze. Apply a thin coat of Petrolatum (petroleum jelly, "Vaseline") over the wound (unless you have an allergy to this). We recommend that you use a new, sterile tube of Vaseline. Do not pick or remove scabs. Do not remove the yellow or white "healing tissue" from the base of the wound.  Cover the wound with fresh, clean, nonstick gauze and secure with paper tape. You may use Band-Aids in place of gauze and tape if the wound is small enough, but would recommend trimming much of the tape off as there is often too much. Sometimes Band-Aids can irritate the skin.  You should call the office for your biopsy report after 1 week if you have not already been contacted.  If you experience any problems, such as abnormal amounts of bleeding, swelling, significant bruising, significant pain, or evidence of infection, please call the office immediately.  FOR ADULT SURGERY PATIENTS: If you need something for pain relief you may take 1 extra strength Tylenol (acetaminophen) AND 2 Ibuprofen ('200mg'$  each) together every 4 hours as needed for pain. (do not take these if you are allergic to them or if you have a reason you should not take them.) Typically, you may only need pain medication for 1 to 3 days.      Due to recent changes in healthcare laws, you may see results of your pathology and/or laboratory studies on MyChart before the doctors have had a chance to review them. We understand that in some cases there may be results that are confusing or concerning to you. Please understand that not all results are received at the same time and often the doctors may need to interpret multiple results in order to provide you with the best  plan of care or course of treatment. Therefore, we ask that you please give [place] 2 business days to thoroughly review all your results before contacting the office for clarification. Should we see a critical lab result, you will be contacted sooner.   If You Need Anything After Your Visit  If you have any questions or concerns for your doctor, please call our main line at [phone] and press option 4 to reach your doctor's medical assistant. If no one answers, please leave a voicemail as directed and we will return your call as soon as possible. Messages left after 4 pm will be answered the following business day.   You may also send [place] a message via [facility]. We typically respond to MyChart messages within 1-2 business days.  For prescription refills, please ask your pharmacy to contact our office. Our fax number is [phone].  If you have an urgent issue when the clinic is closed that cannot wait until the next business day, you can page your doctor at the number below.    Please note that while we do our best to be available for urgent issues outside of office hours, we are not available 24/7.   If you have an urgent issue and are unable to reach [place], you may choose to seek medical care at your doctor's office, retail clinic, urgent care center, or emergency room.  If you have a medical emergency, please immediately call 911 or go to the emergency department.  Pager Numbers  - Dr. Nehemiah Massed: [phone]  - Dr. Laurence Ferrari: [phone]  - Dr. Nicole Kindred: [phone]  In the event of inclement weather, please call our main line at [phone] for an update on the status of any delays or closures.  Dermatology Medication Tips: Please keep the boxes that topical medications come in in order to help keep track of the instructions about where and how to use these. Pharmacies typically print the medication instructions only on the boxes and not directly on the medication tubes.   If your  medication is too expensive, please contact our office at [phone] option 4 or send [place] a message through [place].   We are unable to tell what your co-pay for medications will be in advance as this is different depending on your insurance coverage. However, we may be able to find a substitute medication at lower cost or fill out paperwork to get insurance to cover a needed medication.   If a prior authorization is required to get your medication covered by your insurance company, please allow [place] 1-2 business days to complete this process.  Drug prices often vary depending on where the prescription is filled and some pharmacies may offer cheaper prices.  The website www.goodrx.com contains coupons for medications through different pharmacies. The prices here do not account for what the cost may be with help from insurance (it may be cheaper with your insurance), but the website can give you the price if you did not use any insurance.  - You can print the associated coupon and take it with your prescription to the pharmacy.  - You may also stop by our office during regular business hours and pick up a GoodRx coupon card.  - If you need your prescription sent electronically to a different pharmacy, notify our office through [facility] or by phone at [phone] option 4.     Si Usted Necesita Algo Despus de Su Visita  Tambin puede enviarnos un mensaje a travs de Pharmacist, community. Por lo general respondemos a los mensajes de MyChart en el transcurso de 1 a 2 das hbiles.  Para renovar recetas, por favor pida a su farmacia que se ponga en contacto con nuestra oficina. Harland Dingwall de fax es Mercedes [phone].  Si tiene un asunto urgente cuando la clnica est cerrada y que no puede esperar hasta el siguiente da hbil, puede llamar/localizar a su doctor(a) al nmero que aparece a continuacin.   Por favor, tenga en cuenta que aunque hacemos todo lo posible para estar disponibles para  asuntos urgentes fuera del horario de [place], no estamos disponibles las 24 horas del da, los 7 das de la [place].   Si tiene un problema urgente y no puede comunicarse con nosotros, puede optar por buscar atencin mdica  en el consultorio de su doctor(a), en una clnica privada, en un centro de atencin urgente o en una sala de emergencias.  Si tiene Engineering geologist, por favor llame inmediatamente al 911 o vaya a la sala de emergencias.  Nmeros de bper  - Dr. Nehemiah Massed: [phone]  - Dra. Moye: [phone]  - Dra. Nicole Kindred: [phone]  En caso de inclemencias del [place], por favor llame a Johnsie Kindred principal al [phone] para una actualizacin sobre el [place] de cualquier retraso o cierre.  Consejos para la medicacin en dermatologa: Por favor, guarde las cajas en las que vienen los medicamentos de uso tpico para ayudarle a seguir las instrucciones sobre dnde y cmo usarlos. Las farmacias generalmente imprimen  instrucciones del medicamento slo en las cajas y no directamente en los tubos del medicamento.   Si su medicamento es muy caro, por favor, pngase en contacto con nuestra oficina llamando al [phone] y presione la opcin 4 o envenos un mensaje a travs de MyChart.   No podemos decirle cul ser su copago por los medicamentos por adelantado ya que esto es diferente dependiendo de la cobertura de su seguro. Sin embargo, es posible que podamos encontrar un medicamento sustituto a menor costo o llenar un formulario para que el seguro cubra el medicamento que se considera necesario.   Si se requiere una autorizacin previa para que su compaa de seguros cubra su medicamento, por favor permtanos de 1 a 2 das hbiles para completar este proceso.  Los precios de los medicamentos varan con frecuencia dependiendo del lugar de dnde se surte la receta y alguna farmacias pueden ofrecer precios ms baratos.  El sitio web www.goodrx.com tiene cupones para  medicamentos de diferentes farmacias. Los precios aqu no tienen en cuenta lo que podra costar con la ayuda del seguro (puede ser ms barato con su seguro), pero el sitio web puede darle el precio si no utiliz ningn seguro.  - Puede imprimir el cupn correspondiente y llevarlo con su receta a la farmacia.  - Tambin puede pasar por nuestra oficina durante el horario de atencin regular y recoger una tarjeta de cupones de GoodRx.  - Si necesita que su receta se enve electrnicamente a una farmacia diferente, informe a nuestra oficina a travs de MyChart de [place] o por telfono llamando al [phone] y presione la opcin 4.  

## 2022-06-17 NOTE — Progress Notes (Signed)
Follow-Up Visit   Subjective  Trevor Estrada is a 46 y.o. male who presents for the following: lesion (Right forearm. Dur: 1 month. Pink spot. Patient thinks could be a basal cell skin cancer. New mole at left temple. Dur: 4-5 months. Check mole on back. Would like removed. Has splinter in L-4 finger, cannot get out).  The patient has spots, moles and lesions to be evaluated, some may be new or changing and the patient has concerns that these could be cancer.   The following portions of the chart were reviewed this encounter and updated as appropriate:  Tobacco  Allergies  Meds  Problems  Med Hx  Surg Hx  Fam Hx      Review of Systems: No other skin or systemic complaints except as noted in HPI or Assessment and Plan.   Objective  Well appearing patient in no apparent distress; mood and affect are within normal limits.  All skin waist up examined.  Right Forearm - Posterior, left temple Stuck-on, waxy, tan-brown papule or plaque --Discussed benign etiology and prognosis.   right upper back 0.7 cm erythematous tan papule       mid back right of mid line Verrucous papules -- Discussed viral etiology and contagion.   Left Lateral Back 0.8 cm brown thin papule, slightly lighter from 2-6 o'clock     Right Tip of Nose excoriation   Assessment & Plan   History of Basal Cell Carcinoma of the Skin - No evidence of recurrence today - Recommend regular full body skin exams - Recommend daily broad spectrum sunscreen SPF 30+ to sun-exposed areas, reapply every 2 hours as needed.  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 Excised 10/23/2021. - No evidence of recurrence today - Recommend regular full body skin exams - Recommend daily broad spectrum sunscreen SPF 30+ to sun-exposed areas, reapply every 2 hours as needed.  - Call if any new or changing lesions are  noted between office visits    Seborrheic keratosis Right Forearm - Posterior, left temple  Reassured benign age-related growth.  Recommend observation.  Discussed cryotherapy if spot(s) become irritated or inflamed.  Neoplasm of skin right upper back  Epidermal / dermal shaving  Lesion diameter (cm):  0.7 Informed consent: discussed and consent obtained   Patient was prepped and draped in usual sterile fashion: Area prepped with alcohol. Anesthesia: the lesion was anesthetized in a standard fashion   Anesthetic:  1% lidocaine w/ epinephrine 1-100,000 buffered w/ 8.4% NaHCO3 Instrument used: flexible razor blade   Hemostasis achieved with: pressure, aluminum chloride and electrodesiccation   Outcome: patient tolerated procedure well   Post-procedure details: wound care instructions given   Post-procedure details comment:  Ointment and small bandage applied  Specimen 1 - Surgical pathology Differential Diagnosis: Irritated nevus vs other  Check Margins: No  Other viral warts mid back right of mid line  Discussed viral etiology and risk of spread.  Discussed multiple treatments may be required to clear warts.  Discussed possible post-treatment dyspigmentation and risk of recurrence.  Destruction of lesion - mid back right of mid line  Destruction method: cryotherapy   Informed consent: discussed and consent obtained   Lesion destroyed using liquid nitrogen: Yes   Outcome: patient tolerated procedure well with no complications   Post-procedure details: wound care instructions given   Additional details:  Prior to procedure, discussed risks of blister  formation, small wound, skin dyspigmentation, or rare scar following cryotherapy. Recommend Vaseline ointment to treated areas while healing.   Nevus Left Lateral Back  Benign-appearing.  Observation.  Call clinic for new or changing lesions.  Recommend daily use of broad spectrum spf 30+ sunscreen to sun-exposed areas.    Recheck at follow up  Excoriation Right Tip of Nose  Apply Mupirocin twice daily. Avoid scratching/picking at area. Call if not resolved.  mupirocin ointment (BACTROBAN) 2 % - Right Tip of Nose Apply twice daily to affected area on nose   No splinter remaining on exam.  Return for Wart Follow UP in 6 weeks.  I, Emelia Salisbury, CMA, am acting as scribe for Forest Gleason, MD.  Documentation: I have reviewed the above documentation for accuracy and completeness, and I agree with the above.  Forest Gleason, MD

## 2022-06-18 ENCOUNTER — Telehealth: Payer: Self-pay

## 2022-06-18 NOTE — Telephone Encounter (Signed)
Left message for patient to call office for results/hd 

## 2022-06-18 NOTE — Telephone Encounter (Signed)
-----   Message from Alfonso Patten, MD sent at 06/18/2022  5:39 PM EDT ----- Skin , right upper back MELANOCYTIC NEVUS, INTRADERMAL TYPE, IRRITATED  This is a NORMAL MOLE. No additional treatment is needed. If you notice any new or changing spots or have other skin concerns in future, please call our office at [phone].      MAs please call. Thank you!

## 2022-06-19 ENCOUNTER — Telehealth: Payer: Self-pay

## 2022-06-19 NOTE — Telephone Encounter (Signed)
-----   Message from Alfonso Patten, MD sent at 06/18/2022  5:39 PM EDT ----- Skin , right upper back MELANOCYTIC NEVUS, INTRADERMAL TYPE, IRRITATED  This is a NORMAL MOLE. No additional treatment is needed. If you notice any new or changing spots or have other skin concerns in future, please call our office at [phone].      MAs please call. Thank you!

## 2022-06-19 NOTE — Telephone Encounter (Signed)
Patient notified of bx results AF 

## 2022-06-29 ENCOUNTER — Encounter: Payer: Self-pay | Admitting: Dermatology

## 2022-07-30 ENCOUNTER — Ambulatory Visit: Payer: BLUE CROSS/BLUE SHIELD | Admitting: Dermatology

## 2022-11-18 ENCOUNTER — Ambulatory Visit: Payer: BLUE CROSS/BLUE SHIELD | Admitting: Dermatology

## 2023-03-16 ENCOUNTER — Other Ambulatory Visit: Payer: Self-pay

## 2023-09-21 ENCOUNTER — Ambulatory Visit: Payer: BLUE CROSS/BLUE SHIELD | Admitting: Dermatology

## 2024-02-13 ENCOUNTER — Emergency Department

## 2024-02-13 ENCOUNTER — Other Ambulatory Visit: Payer: Self-pay

## 2024-02-13 ENCOUNTER — Emergency Department
Admission: EM | Admit: 2024-02-13 | Discharge: 2024-02-13 | Disposition: A | Discharge status: Home / Self Care | Attending: Emergency Medicine | Admitting: Emergency Medicine

## 2024-02-13 DIAGNOSIS — J029 Acute pharyngitis, unspecified: Secondary | ICD-10-CM | POA: Diagnosis present

## 2024-02-13 DIAGNOSIS — D72829 Elevated white blood cell count, unspecified: Secondary | ICD-10-CM | POA: Diagnosis not present

## 2024-02-13 DIAGNOSIS — J36 Peritonsillar abscess: Secondary | ICD-10-CM | POA: Diagnosis not present

## 2024-02-13 LAB — CBC WITH DIFFERENTIAL/PLATELET
Abs Immature Granulocytes: 0.05 10*3/uL (ref 0.00–0.07)
Basophils Absolute: 0.1 10*3/uL (ref 0.0–0.1)
Basophils Relative: 0 %
Eosinophils Absolute: 0.2 10*3/uL (ref 0.0–0.5)
Eosinophils Relative: 1 %
HCT: 41.3 % (ref 39.0–52.0)
Hemoglobin: 14.6 g/dL (ref 13.0–17.0)
Immature Granulocytes: 0 %
Lymphocytes Relative: 11 %
Lymphs Abs: 1.8 10*3/uL (ref 0.7–4.0)
MCH: 32.5 pg (ref 26.0–34.0)
MCHC: 35.4 g/dL (ref 30.0–36.0)
MCV: 92 fL (ref 80.0–100.0)
Monocytes Absolute: 1.2 10*3/uL — ABNORMAL HIGH (ref 0.1–1.0)
Monocytes Relative: 7 %
Neutro Abs: 13.1 10*3/uL — ABNORMAL HIGH (ref 1.7–7.7)
Neutrophils Relative %: 81 %
Platelets: 286 10*3/uL (ref 150–400)
RBC: 4.49 MIL/uL (ref 4.22–5.81)
RDW: 12.5 % (ref 11.5–15.5)
WBC: 16.4 10*3/uL — ABNORMAL HIGH (ref 4.0–10.5)
nRBC: 0 % (ref 0.0–0.2)

## 2024-02-13 LAB — BASIC METABOLIC PANEL WITH GFR
Anion gap: 8 (ref 5–15)
BUN: 12 mg/dL (ref 6–20)
CO2: 26 mmol/L (ref 22–32)
Calcium: 9.3 mg/dL (ref 8.9–10.3)
Chloride: 104 mmol/L (ref 98–111)
Creatinine, Ser: 0.93 mg/dL (ref 0.61–1.24)
GFR, Estimated: >60 mL/min (ref >60)
Glucose, Bld: 105 mg/dL — ABNORMAL HIGH (ref 70–99)
Potassium: 3.7 mmol/L (ref 3.5–5.1)
Sodium: 138 mmol/L (ref 135–145)

## 2024-02-13 LAB — LACTIC ACID, PLASMA: Lactic Acid, Venous: 2 mmol/L (ref 0.5–1.9)

## 2024-02-13 LAB — MAGNESIUM: Magnesium: 2 mg/dL (ref 1.7–2.4)

## 2024-02-13 LAB — GROUP A STREP BY PCR: Group A Strep by PCR: NOT DETECTED

## 2024-02-13 MED ORDER — SODIUM CHLORIDE 0.9 % IV SOLN
3.0000 g | Freq: Once | INTRAVENOUS | Status: AC
  Administered 2024-02-13: 3 g via INTRAVENOUS
  Filled 2024-02-13: qty 8

## 2024-02-13 MED ORDER — CLINDAMYCIN PHOSPHATE 900 MG/50ML IV SOLN
900.0000 mg | Freq: Once | INTRAVENOUS | Status: AC
  Administered 2024-02-13: 900 mg via INTRAVENOUS
  Filled 2024-02-13: qty 50

## 2024-02-13 MED ORDER — PREDNISONE 10 MG (21) PO TBPK: ORAL_TABLET | ORAL | 0 refills | Status: AC

## 2024-02-13 MED ORDER — IOHEXOL 300 MG/ML  SOLN
80.0000 mL | Freq: Once | INTRAMUSCULAR | Status: AC | PRN
  Administered 2024-02-13: 80 mL via INTRAVENOUS

## 2024-02-13 MED ORDER — LIDOCAINE VISCOUS HCL 2 % MT SOLN: 15.0000 mL | OROMUCOSAL | 0 refills | Status: AC | PRN

## 2024-02-13 MED ORDER — DEXAMETHASONE SODIUM PHOSPHATE 10 MG/ML IJ SOLN
10.0000 mg | Freq: Once | INTRAMUSCULAR | Status: AC
  Administered 2024-02-13: 10 mg via INTRAVENOUS
  Filled 2024-02-13: qty 1

## 2024-02-13 MED ORDER — SODIUM CHLORIDE 0.9 % IV BOLUS
500.0000 mL | Freq: Once | INTRAVENOUS | Status: AC
  Administered 2024-02-13: 500 mL via INTRAVENOUS

## 2024-02-13 MED ORDER — MORPHINE SULFATE (PF) 4 MG/ML IV SOLN
4.0000 mg | Freq: Once | INTRAVENOUS | Status: AC
  Administered 2024-02-13: 4 mg via INTRAVENOUS
  Filled 2024-02-13: qty 1

## 2024-02-13 MED ORDER — ONDANSETRON HCL 4 MG/2ML IJ SOLN
4.0000 mg | Freq: Once | INTRAMUSCULAR | Status: AC
  Administered 2024-02-13: 4 mg via INTRAVENOUS
  Filled 2024-02-13: qty 2

## 2024-02-13 MED ORDER — ACETAMINOPHEN 325 MG PO TABS
650.0000 mg | ORAL_TABLET | Freq: Once | ORAL | Status: AC
  Administered 2024-02-13: 650 mg via ORAL
  Filled 2024-02-13: qty 2

## 2024-02-13 MED ORDER — AMOXICILLIN-POT CLAVULANATE 875-125 MG PO TABS: 1.0000 | ORAL_TABLET | Freq: Two times a day (BID) | ORAL | 0 refills | Status: AC

## 2024-02-13 MED ORDER — LIDOCAINE VISCOUS HCL 2 % MT SOLN
15.0000 mL | Freq: Once | OROMUCOSAL | Status: AC
  Administered 2024-02-13: 15 mL via OROMUCOSAL
  Filled 2024-02-13: qty 15

## 2024-02-13 NOTE — ED Triage Notes (Signed)
 Pt report sore throat since this morning, went to urgent care and was sent to ER for further evaluation. Pt reports last time he had a bacteria in his tonsils. Pt talks in complete sentences no respiratory distress noted

## 2024-02-13 NOTE — ED Provider Triage Note (Signed)
 Emergency Medicine Provider Triage Evaluation Note  Trevor Estrada , a 48 y.o. male  was evaluated in triage.  Pt complains of sore throat, hx of peritonsillar abscess.   patient states pain is on the left side.  Sent by urgent care  Review of Systems  Positive:  Negative:   Physical Exam  BP (!) 151/125 (BP Location: Left Arm)   Pulse (!) 104   Temp 98.4 F (36.9 C) (Oral)   Resp 16   Ht 5\' 9"  (1.753 m)   Wt 93.9 kg   SpO2 99%   BMI 30.57 kg/m  Gen:   Awake, no distress   Resp:  Normal effort  MSK:   Moves extremities without difficulty  Other:  Left-sided tonsil swelling typical of peritonsillar abscess  Medical Decision Making  Medically screening exam initiated at 2:53 PM.  Appropriate orders placed.  Trevor Estrada was informed that the remainder of the evaluation will be completed by another provider, this initial triage assessment does not replace that evaluation, and the importance of remaining in the ED until their evaluation is complete.  Labs, imaging ordered   Trevor Figures, PA-C 02/13/24 1456

## 2024-02-13 NOTE — ED Provider Notes (Signed)
 [place] EMERGENCY DEPARTMENT AT [facility]ION[facility] Provider Note   CSN: [id number] Arrival date & time: 02/13/24  1436     History  Chief Complaint  Patient presents with   Sore Throat    Trevor Estrada is a 48 y.o. male.  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  Acute onset today of painful swallowing, swelling of throat.  No difficulty breathing or swallowing.  No reported fevers at home.  No known ill contacts.   Sore Throat Pertinent negatives include no chest pain, no abdominal pain and no shortness of breath.       Home Medications Prior to Admission medications   Medication Sig Start Date End Date Taking? Authorizing Provider  amoxicillin -clavulanate (AUGMENTIN ) 875-125 MG tablet Take 1 tablet by mouth 2 (two) times daily for 10 days. 02/13/24 02/23/24 Yes Nataline Basara, Kaaren Ora, PA-C  lidocaine  (XYLOCAINE ) 2 % solution Use as directed 15 mLs in the mouth or throat as needed for up to 5 days for mouth pain. 02/13/24 02/18/24 Yes Hollie Luria, PA-C  predniSONE (STERAPRED UNI-PAK 21 TAB) 10 MG (21) TBPK tablet Take as directed 02/13/24  Yes Haeley Fordham, PA-C  mupirocin  ointment (BACTROBAN ) 2 % Apply 1 application topically daily. With dressing changes 10/23/21   Moye, Virginia , MD  mupirocin  ointment (BACTROBAN ) 2 % Apply twice daily to affected area on nose 06/17/22   Moye, Virginia , MD      Allergies    Patient has no known allergies.    Review of Systems   Review of Systems  Constitutional:  Negative for chills and fever.  HENT:  Positive for sore throat. Negative for ear pain and trouble swallowing.   Respiratory:  Negative for cough and shortness of breath.   Cardiovascular:  Negative for chest pain and palpitations.  Gastrointestinal:  Negative for abdominal pain and vomiting.  Genitourinary:  Negative for dysuria.  Skin:  Negative for color change and rash.  All other systems reviewed and are negative.   Physical Exam Updated Vital Signs BP  (!) 151/125 (BP Location: Left Arm)   Pulse (!) 104   Temp 98.4 F (36.9 C) (Oral)   Resp 16   Ht 5\' 9"  (1.753 m)   Wt 93.9 kg   SpO2 99%   BMI 30.57 kg/m  Physical Exam Vitals reviewed.  Constitutional:      Appearance: Normal appearance.  HENT:     Head: Normocephalic and atraumatic.     Nose: Nose normal.     Mouth/Throat:     Comments: Soft tissue oropharyngeal swelling.  Mild right tonsillar edema.  No oral airway obstruction.  Mild submandibular swelling. Cardiovascular:     Pulses: Normal pulses.  Pulmonary:     Effort: Pulmonary effort is normal.  Musculoskeletal:     Cervical back: Normal range of motion.  Neurological:     Mental Status: He is alert and oriented to person, place, and time. Mental status is at baseline.  Psychiatric:        Mood and Affect: Mood normal.        Behavior: Behavior normal.     ED Results / Procedures / Treatments   Labs (all labs ordered are listed, but only abnormal results are displayed) Labs Reviewed  BASIC METABOLIC PANEL WITH GFR - Abnormal; Notable for the following components:      Result Value   Glucose, Bld 105 (*)    All other components within normal limits  CBC WITH DIFFERENTIAL/PLATELET -  Abnormal; Notable for the following components:   WBC 16.4 (*)    Neutro Abs 13.1 (*)    Monocytes Absolute 1.2 (*)    All other components within normal limits  LACTIC ACID, PLASMA - Abnormal; Notable for the following components:   Lactic Acid, Venous 2.0 (*)    All other components within normal limits  GROUP A STREP BY PCR  CULTURE, BLOOD (ROUTINE X 2)  CULTURE, BLOOD (ROUTINE X 2)  MAGNESIUM    EKG None  Radiology CT Soft Tissue Neck W Contrast Result Date: 02/13/2024 CLINICAL DATA:  Sore throat beginning today. Epiglottitis or tonsillitis suspected. CT neck with contrast 02/12/2021. EXAM: CT NECK WITH CONTRAST TECHNIQUE: Multidetector CT imaging of the neck was performed using the standard protocol following the  bolus administration of intravenous contrast. RADIATION DOSE REDUCTION: This exam was performed according to the departmental dose-optimization program which includes automated exposure control, adjustment of the mA and/or kV according to patient size and/or use of iterative reconstruction technique. CONTRAST:  80mL OMNIPAQUE  IOHEXOL  300 MG/ML  SOLN COMPARISON:  CT of the neck 03/04/2021. FINDINGS: Pharynx and larynx: Nasopharynx is within normal limits. Asymmetric hypertrophy of the palatine tonsils is worse on the left. Heterogeneous foci of hypoattenuation are present in and about the left palatine tonsil. The largest measures up to 9 mm. Stranding is present the parapharyngeal fat. The or pharyngeal airway is occluded posteriorly. Lingual tonsils are I per trophy. No discrete mass lesion is present. Vallecula and epiglottis are within normal limits. Aryepiglottic folds and piriform sinuses are clear. Vocal cords are midline and symmetric. Trachea is clear. Salivary glands: The submandibular and parotid glands and ducts are within normal limits. Thyroid: Normal Lymph nodes: Enlarged reactive level 2 lymph nodes are present bilaterally, left greater than right. No suppurative or hyperdense nodes are present. Vascular: No significant vascular disease is present. Limited intracranial: Within normal limits. Visualized orbits: The globes and orbits are within normal limits. Mastoids and visualized paranasal sinuses: Polyp or mucous retention cyst is present left maxillary sinus. Skeleton: Vertebral body heights and alignment are normal. Straightening of the normal cervical lordosis is present. No focal osseous lesions are present. Upper chest: The lung apices are clear. The thoracic inlet is within normal limits. IMPRESSION: 1. Asymmetric hypertrophy of the palatine tonsils is worse on the left. 2. Heterogeneous foci of hypoattenuation in and about the left palatine tonsil compatible with phlegmon and developing  abscess. The largest measures up to 9 mm. No drainable abscess is present. 3. Reactive level 2 lymph nodes bilaterally, left greater than right. 4. Polyp or mucous retention cyst in the left maxillary sinus. Electronically Signed   By: Audree Leas M.D.   On: 02/13/2024 16:45    Procedures Procedures    Medications Ordered in ED Medications  Ampicillin -Sulbactam (UNASYN ) 3 g in sodium chloride  0.9 % 100 mL IVPB (3 g Intravenous New Bag/Given 02/13/24 1732)  sodium chloride  0.9 % bolus 500 mL (0 mLs Intravenous Stopped 02/13/24 1711)  dexamethasone  (DECADRON ) injection 10 mg (10 mg Intravenous Given 02/13/24 1619)  clindamycin (CLEOCIN) IVPB 900 mg (0 mg Intravenous Stopped 02/13/24 1711)  iohexol  (OMNIPAQUE ) 300 MG/ML solution 80 mL (80 mLs Intravenous Contrast Given 02/13/24 1557)  morphine  (PF) 4 MG/ML injection 4 mg (4 mg Intravenous Given 02/13/24 1709)  ondansetron  (ZOFRAN ) injection 4 mg (4 mg Intravenous Given 02/13/24 1707)  acetaminophen  (TYLENOL ) tablet 650 mg (650 mg Oral Given 02/13/24 1729)  lidocaine  (XYLOCAINE ) 2 % viscous mouth solution 15  mL (15 mLs Mouth/Throat Given 02/13/24 1735)    ED Course/ Medical Decision Making/ A&P                                 Medical Decision Making Patient with a history of Ludwick's here for sore throat and oral swelling.  He does have some evidence of oral swelling however his airway appears intact he has no difficulty breathing or swallowing.  He is mildly tachycardic with a white count of 16.  Negative strep.  Given his history of Ludwick's we will go ahead and start on antibiotics empirically with clindamycin as well as Decadron .  Obtain CT.  CT does show evidence of tonsillar swelling with no abscess likely phlegmon or developing abscess.  Does have a leukocytosis mild tachycardia.  He is tolerating oral tolerating secretions overall well-appearing.  I did discuss with on-call ENT Dr. Donnie Galea and discussed the images labs and appearance of  patient.  He does recommend antibiotics with Unasyn  and Augmentin .  Able to see in office on Monday 815.  Patient is in agreement and well-appearing.  Given return precautions.  Amount and/or Complexity of Data Reviewed Labs: ordered.  Risk OTC drugs. Prescription drug management.           Final Clinical Impression(s) / ED Diagnoses Final diagnoses:  Tonsillar abscess    Rx / DC Orders ED Discharge Orders          Ordered    amoxicillin -clavulanate (AUGMENTIN ) 875-125 MG tablet  2 times daily        02/13/24 1722    predniSONE (STERAPRED UNI-PAK 21 TAB) 10 MG (21) TBPK tablet        02/13/24 1722    lidocaine  (XYLOCAINE ) 2 % solution  As needed        02/13/24 1733              Hollie Luria, PA-C 02/13/24 1800    Lind Repine, MD 02/13/24 1844

## 2024-02-18 LAB — CULTURE, BLOOD (ROUTINE X 2)
Culture: NO GROWTH
Culture: NO GROWTH

## 2024-10-10 ENCOUNTER — Ambulatory Visit: Admitting: Dermatology

## 2024-10-10 ENCOUNTER — Encounter: Payer: Self-pay | Admitting: Dermatology

## 2024-10-10 DIAGNOSIS — D225 Melanocytic nevi of trunk: Secondary | ICD-10-CM

## 2024-10-10 DIAGNOSIS — W908XXA Exposure to other nonionizing radiation, initial encounter: Secondary | ICD-10-CM

## 2024-10-10 DIAGNOSIS — L720 Epidermal cyst: Secondary | ICD-10-CM | POA: Diagnosis not present

## 2024-10-10 DIAGNOSIS — L578 Other skin changes due to chronic exposure to nonionizing radiation: Secondary | ICD-10-CM

## 2024-10-10 DIAGNOSIS — D485 Neoplasm of uncertain behavior of skin: Secondary | ICD-10-CM | POA: Diagnosis not present

## 2024-10-10 DIAGNOSIS — R238 Other skin changes: Secondary | ICD-10-CM

## 2024-10-10 DIAGNOSIS — D1801 Hemangioma of skin and subcutaneous tissue: Secondary | ICD-10-CM

## 2024-10-10 DIAGNOSIS — Z85828 Personal history of other malignant neoplasm of skin: Secondary | ICD-10-CM | POA: Diagnosis not present

## 2024-10-10 DIAGNOSIS — D229 Melanocytic nevi, unspecified: Secondary | ICD-10-CM

## 2024-10-10 DIAGNOSIS — L821 Other seborrheic keratosis: Secondary | ICD-10-CM | POA: Diagnosis not present

## 2024-10-10 DIAGNOSIS — L814 Other melanin hyperpigmentation: Secondary | ICD-10-CM | POA: Diagnosis not present

## 2024-10-10 NOTE — Progress Notes (Signed)
 "  Follow-Up Visit   Subjective  Trevor Estrada is a 48 y.o. male who presents for the following: dark spot on the L neck x 1 mths, not symptomatic, but new and worrisome, cyst on the R post auricular, no hx of rupture/drainage, but bothersome to pt and he would like removed. Pt would like his back checked today for skin cancer screening since he can't see area.   The following portions of the chart were reviewed this encounter and updated as appropriate: medications, allergies, medical history  Review of Systems:  No other skin or systemic complaints except as noted in HPI or Assessment and Plan.  Objective  Well appearing patient in no apparent distress; mood and affect are within normal limits.   A focused examination was performed of the following areas: the face, neck, back, chest, arms, and hands   Relevant exam findings are noted in the Assessment and Plan.  Left Upper Back 0.9 x 0.4 cm irregular pigmented macule.   Assessment & Plan   Venous lake vs angioma - L superior chest Reassured benign appearing, RTC PRN change.  EPIDERMAL INCLUSION CYST Exam: Subcutaneous nodule at R post auricular  Benign-appearing. Exam most consistent with an epidermal inclusion cyst. Discussed that a cyst is a benign growth that can grow over time and sometimes get irritated or inflamed. Recommend observation if it is not bothersome. Discussed option of surgical excision to remove it if it is growing, symptomatic, or other changes noted. Please call for new or changing lesions so they can be evaluated.  ACTINIC DAMAGE - chronic, secondary to cumulative UV radiation exposure/sun exposure over time - diffuse scaly erythematous macules with underlying dyspigmentation - Recommend daily broad spectrum sunscreen SPF 30+ to sun-exposed areas, reapply every 2 hours as needed.  - Recommend staying in the shade or wearing long sleeves, sun glasses (UVA+UVB protection) and wide brim hats (4-inch  brim around the entire circumference of the hat). - Call for new or changing lesions.  SEBORRHEIC KERATOSIS - Stuck-on, waxy, tan-brown papules and/or plaques  - Benign-appearing - Discussed benign etiology and prognosis. - Observe - Call for any changes  LENTIGINES Exam: scattered tan macules Due to sun exposure Treatment Plan: Benign-appearing, observe. Recommend daily broad spectrum sunscreen SPF 30+ to sun-exposed areas, reapply every 2 hours as needed.  Call for any changes  HEMANGIOMA Exam: red papule(s) Discussed benign nature. Recommend observation. Call for changes.  HISTORY OF BASAL CELL CARCINOMA OF THE SKIN - No evidence of recurrence today - Recommend regular full body skin exams - Recommend daily broad spectrum sunscreen SPF 30+ to sun-exposed areas, reapply every 2 hours as needed.  - Call if any new or changing lesions are noted between office visits  MELANOCYTIC NEVI Exam: Tan-brown and/or pink-flesh-colored symmetric macules and papules  Treatment Plan: Benign appearing on exam today. Recommend observation. Call clinic for new or changing moles. Recommend daily use of broad spectrum spf 30+ sunscreen to sun-exposed areas.  NEOPLASM OF UNCERTAIN BEHAVIOR OF SKIN Left Upper Back - Epidermal / dermal shaving  Lesion diameter (cm):  0.9 Informed consent: discussed and consent obtained   Timeout: patient name, date of birth, surgical site, and procedure verified   Procedure prep:  Patient was prepped and draped in usual sterile fashion Prep type:  Isopropyl alcohol Anesthesia: the lesion was anesthetized in a standard fashion   Anesthetic:  1% lidocaine  w/ epinephrine 1-100,000 buffered w/ 8.4% NaHCO3 Instrument used: DermaBlade   Hemostasis achieved with: pressure and  aluminum chloride   Outcome: patient tolerated procedure well   Post-procedure details: wound care instructions given    Specimen 1 - Surgical pathology Differential Diagnosis: D48.5 r/o  dysplastic nevus vs MM Check Margins: Yes MULTIPLE BENIGN NEVI   LENTIGINES   ACTINIC ELASTOSIS   SEBORRHEIC KERATOSES   CHERRY ANGIOMA   EIC (EPIDERMAL INCLUSION CYST)   VENOUS [place]    Return in about 1 year (around 10/10/2025) for TBSE - hx BCC, AK, dysplastic nevi, cyst excision surgery.  LILLETTE Trevor Estrada, CMA, am acting as scribe for Boneta Sharps, MD .   Documentation: I have reviewed the above documentation for accuracy and completeness, and I agree with the above.  Boneta Sharps, MD    "

## 2024-10-10 NOTE — Patient Instructions (Addendum)
 Wound Care Instructions  Cleanse wound gently with soap and water once a day then pat dry with clean gauze. Apply a thin coat of Petrolatum (petroleum jelly, Vaseline) over the wound (unless you have an allergy to this). We recommend that you use a new, sterile tube of Vaseline. Do not pick or remove scabs. Do not remove the yellow or white healing tissue from the base of the wound.  Cover the wound with fresh, clean, nonstick gauze and secure with paper tape. You may use Band-Aids in place of gauze and tape if the wound is small enough, but would recommend trimming much of the tape off as there is often too much. Sometimes Band-Aids can irritate the skin.  You should call the office for your biopsy report after 1 week if you have not already been contacted.  If you experience any problems, such as abnormal amounts of bleeding, swelling, significant bruising, significant pain, or evidence of infection, please call the office immediately.  FOR ADULT SURGERY PATIENTS: If you need something for pain relief you may take 1 extra strength Tylenol  (acetaminophen ) AND 2 Ibuprofen  (200mg  each) together every 4 hours as needed for pain. (do not take these if you are allergic to them or if you have a reason you should not take them.) Typically, you may only need pain medication for 1 to 3 days.      Pre-Operative Instructions You are scheduled for a surgical procedure at [facility]. We recommend you read the following instructions. If you have any questions or concerns, please call the office at [phone].  Shower and wash the entire body with soap and water the day of your surgery paying special attention to cleansing at and around the planned surgery site.  Please continue to take your anticoagulants (blood thinners) as you normally     would before and after surgery if they were prescribed by a medical provider. Stopping them could be harmful to you. We have multiple tools in  dermatology to stop the bleeding even if you take an anticoagulant. If you take over the counter blood thinner such as aspirin, Ibuprofen  (Motrin , Advil  and Nuprin ), Naprosyn, Voltaren, Relafen, etc. that was not prescribed or recommended by a medical provider, we recommend that you stop taking it for a week before your surgery and wait to restart until 2 days after your surgery.  Please inform us  of all medications you are currently taking. All medications that are taken regularly should be taken the day of surgery as you always do. Nevertheless, we need to be informed of what medications you are taking prior to surgery to know whether they will affect the procedure or cause any complications.   Please inform us  of any medication allergies. Also inform us  of whether you have allergies to Latex or rubber products or whether you have had any adverse reaction to Lidocaine  or Epinephrine .  Please inform us  of any prosthetic or artificial body parts such as artificial heart valve, joint replacements, etc., or similar condition that might require preoperative antibiotics.   We recommend avoidance of alcohol at least two weeks prior to surgery and continued avoidance for at least two weeks after surgery.   We recommend discontinuation of tobacco smoking at least two weeks prior to surgery and continued abstinence for at least two weeks after surgery.  Do not plan strenuous exercise, strenuous work or strenuous lifting for approximately four weeks after your surgery.   We request if you are unable to make your  scheduled surgical appointment, please call us  at least a week in advance or as soon as you are aware of a problem so that we can cancel or reschedule the appointment.   You MAY TAKE TYLENOL  (acetaminophen ) for pain as it is not a blood thinner.   PLEASE PLAN TO BE IN TOWN FOR TWO WEEKS FOLLOWING SURGERY, THIS IS IMPORTANT SO YOU CAN BE CHECKED FOR DRESSING CHANGES, FUTURE REMOVAL AND TO MONITOR FOR  POSSIBLE COMPLICATIONS.     Due to recent changes in healthcare laws, you may see results of your pathology and/or laboratory studies on MyChart before the doctors have had a chance to review them. We understand that in some cases there may be results that are confusing or concerning to you. Please understand that not all results are received at the same time and often the doctors may need to interpret multiple results in order to provide you with the best plan of care or course of treatment. Therefore, we ask that you please give us  2 business days to thoroughly review all your results before contacting the office for clarification. Should we see a critical lab result, you will be contacted sooner.   If You Need Anything After Your Visit  If you have any questions or concerns for your doctor, please call our main line at [phone] and press option 4 to reach your doctor's medical assistant. If no one answers, please leave a voicemail as directed and we will return your call as soon as possible. Messages left after 4 pm will be answered the following business day.   You may also send us  a message via MyChart. We typically respond to MyChart messages within 1-2 business days.  For prescription refills, please ask your pharmacy to contact our office. Our fax number is [phone].  If you have an urgent issue when the clinic is closed that cannot wait until the next business day, you can page your doctor at the number below.    Please note that while we do our best to be available for urgent issues outside of office hours, we are not available 24/7.   If you have an urgent issue and are unable to reach us , you may choose to seek medical care at your doctor's office, retail clinic, urgent care center, or emergency room.  If you have a medical emergency, please immediately call 911 or go to the emergency department.  Pager Numbers  - Dr. Hester: [phone]  - Dr. Jackquline:  [phone]  - Dr. Claudene: [phone]   - Dr. Raymund: [phone]  In the event of inclement weather, please call our main line at [phone] for an update on the status of any delays or closures.  Dermatology Medication Tips: Please keep the boxes that topical medications come in in order to help keep track of the instructions about where and how to use these. Pharmacies typically print the medication instructions only on the boxes and not directly on the medication tubes.   If your medication is too expensive, please contact our office at [phone] option 4 or send us  a message through MyChart.   We are unable to tell what your co-pay for medications will be in advance as this is different depending on your insurance coverage. However, we may be able to find a substitute medication at lower cost or fill out paperwork to get insurance to cover a needed medication.   If a prior authorization is required to get your medication covered by your insurance company,  please allow us  1-2 business days to complete this process.  Drug prices often vary depending on where the prescription is filled and some pharmacies may offer cheaper prices.  The website www.goodrx.com contains coupons for medications through different pharmacies. The prices here do not account for what the cost may be with help from insurance (it may be cheaper with your insurance), but the website can give you the price if you did not use any insurance.  - You can print the associated coupon and take it with your prescription to the pharmacy.  - You may also stop by our office during regular business hours and pick up a GoodRx coupon card.  - If you need your prescription sent electronically to a different pharmacy, notify our office through [facility] or by phone at [phone] option 4.     Si Usted Necesita Algo Despus de Su Visita  Tambin puede enviarnos un mensaje a travs de Clinical cytogeneticist. Por lo general  respondemos a los mensajes de MyChart en el transcurso de 1 a 2 das hbiles.  Para renovar recetas, por favor pida a su farmacia que se ponga en contacto con nuestra oficina. Randi lakes de fax es [place] [phone].  Si tiene un asunto urgente cuando la clnica est cerrada y que no puede esperar hasta el siguiente da hbil, puede llamar/localizar a su doctor(a) al nmero que aparece a continuacin.   Por favor, tenga en cuenta que aunque hacemos todo lo posible para estar disponibles para asuntos urgentes fuera del horario de [place], no estamos disponibles las 24 horas del da, los 7 [address]  [address] de la [place].   Si tiene un problema urgente y no puede comunicarse con nosotros, puede optar por buscar atencin mdica  en el consultorio de su doctor(a), en una clnica privada, en un centro de atencin urgente o en una sala de emergencias.  Si tiene Engineer, drilling, por favor llame inmediatamente al 911 o vaya a la sala de emergencias.  Nmeros de bper  - Dr. Hester: [phone]  - Dra. Jackquline: [phone]  - Dr. Claudene: [phone]  - Dra. Kitts: [phone]  En caso de inclemencias del [place], por favor llame a nuestra lnea principal al [phone] para una actualizacin sobre el estado de cualquier retraso o cierre.  Consejos para la medicacin en dermatologa: Por favor, guarde las cajas en las que vienen los medicamentos de uso tpico para ayudarle a seguir las instrucciones sobre dnde y cmo usarlos. Las farmacias generalmente imprimen las instrucciones del medicamento slo en las cajas y no directamente en los tubos del [place].   Si su medicamento es muy caro, por favor, pngase en contacto con landry rieger llamando al [phone] y presione la opcin 4 o envenos un mensaje a travs de Clinical cytogeneticist.   No podemos decirle cul ser su copago por los medicamentos por adelantado ya que esto es diferente dependiendo de la cobertura de su seguro. Sin embargo, es posible  que podamos encontrar un medicamento sustituto a Audiological scientist un formulario para que el seguro cubra el medicamento que se considera necesario.   Si se requiere una autorizacin previa para que su compaa de seguros malta su medicamento, por favor permtanos de 1 a 2 das hbiles para completar este proceso.  Los precios de los medicamentos varan con frecuencia dependiendo del Environmental consultant de dnde se surte la receta y alguna farmacias pueden ofrecer precios ms baratos.  El sitio web www.goodrx.com tiene cupones para medicamentos de Health and safety inspector. Los precios aqu  no tienen en cuenta lo que podra costar con la ayuda del seguro (puede ser ms barato con su seguro), pero el sitio web puede darle el precio si no Visual merchandiser.  - Puede imprimir el cupn correspondiente y llevarlo con su receta a la farmacia.  - Tambin puede pasar por nuestra oficina durante el horario de atencin regular y Education officer, museum una tarjeta de cupones de GoodRx.  - Si necesita que su receta se enve electrnicamente a una farmacia diferente, informe a nuestra oficina a travs de MyChart de [place] o por telfono llamando al [phone] y presione la opcin 4.

## 2024-10-17 ENCOUNTER — Ambulatory Visit: Payer: Self-pay | Admitting: Dermatology

## 2024-10-17 LAB — SURGICAL PATHOLOGY

## 2024-10-17 NOTE — Telephone Encounter (Signed)
 Called patient to discuss pathology results and treatment plan. N/A. LMOVM. Recommend changing appointment for 11/23/24 from his cyst excision to severely dysplastic nevus excision if patient agrees.

## 2024-10-17 NOTE — Telephone Encounter (Addendum)
 Discussed results with patient and scheduled patient for surgery to remove.  Patient verbalized understanding and denied further questions.   ----- Message from Boneta Sharps, MD sent at 10/17/2024  1:58 PM EST ----- Diagnosis: left upper back :       DYSPLASTIC COMPOUND NEVUS WITH SEVERE ATYPIA, DEEP MARGIN INVOLVED, SEE       DESCRIPTION   Please call to share diagnosis and offer excision.   Explanation: Your biopsy shows an atypical mole. It looks unusual enough that it cannot be distinguished from an early melanoma skin cancer. The safest course of action is to ensure it is fully  removed with a surgery.  Treatment: you return for an hour long appointment where we perform a skin surgery. We numb the site of the skin cancer and a safety margin of normal skin around it. We remove the full thickness of  skin and close the wound with two layers of stitches. The sample is sent to the lab to check that the skin cancer was fully removed. Return one week later to have wound checked and surface stitches  removed. Surgical wound leaves a line scar. Risks include pain, infection, bleeding, thickened scar, wound dehiscence, recurrence

## 2024-11-23 ENCOUNTER — Encounter: Admitting: Dermatology

## 2024-11-30 ENCOUNTER — Encounter: Admitting: Dermatology

## 2024-12-07 ENCOUNTER — Encounter: Admitting: Dermatology

## 2025-10-11 ENCOUNTER — Encounter: Admitting: Dermatology
